# Patient Record
Sex: Female | Born: 1945 | Race: White | Hispanic: No | Smoking: Former smoker
Health system: Southern US, Community
[De-identification: ages and names within clinical notes are randomized; demographics above are authoritative.]

## PROBLEM LIST (undated history)

## (undated) DIAGNOSIS — Z720 Tobacco use: Secondary | ICD-10-CM

## (undated) DIAGNOSIS — N6019 Diffuse cystic mastopathy of unspecified breast: Secondary | ICD-10-CM

## (undated) DIAGNOSIS — Z87891 Personal history of nicotine dependence: Principal | ICD-10-CM

## (undated) DIAGNOSIS — D751 Secondary polycythemia: Secondary | ICD-10-CM

## (undated) DIAGNOSIS — M109 Gout, unspecified: Secondary | ICD-10-CM

## (undated) DIAGNOSIS — J449 Chronic obstructive pulmonary disease, unspecified: Secondary | ICD-10-CM

## (undated) DIAGNOSIS — I10 Essential (primary) hypertension: Secondary | ICD-10-CM

## (undated) DIAGNOSIS — I5032 Chronic diastolic (congestive) heart failure: Secondary | ICD-10-CM

## (undated) DIAGNOSIS — I2699 Other pulmonary embolism without acute cor pulmonale: Secondary | ICD-10-CM

## (undated) DIAGNOSIS — I82402 Acute embolism and thrombosis of unspecified deep veins of left lower extremity: Secondary | ICD-10-CM

## (undated) HISTORY — DX: Personal history of nicotine dependence: Z87.891

## (undated) HISTORY — DX: Chronic diastolic (congestive) heart failure: I50.32

## (undated) HISTORY — DX: Acute embolism and thrombosis of unspecified deep veins of left lower extremity: I82.402

## (undated) HISTORY — DX: Chronic obstructive pulmonary disease, unspecified: J44.9

## (undated) HISTORY — DX: Tobacco use: Z72.0

## (undated) HISTORY — DX: Diffuse cystic mastopathy of unspecified breast: N60.19

## (undated) HISTORY — DX: Secondary polycythemia: D75.1

## (undated) HISTORY — PX: TUBAL LIGATION: SHX77

## (undated) HISTORY — DX: Essential (primary) hypertension: I10

## (undated) HISTORY — DX: Other pulmonary embolism without acute cor pulmonale: I26.99

## (undated) HISTORY — PX: OTHER SURGICAL HISTORY: SHX169

## (undated) HISTORY — PX: CHOLECYSTECTOMY: SHX55

## (undated) HISTORY — DX: Gout, unspecified: M10.9

---

## 2010-06-22 ENCOUNTER — Ambulatory Visit: Payer: Self-pay | Admitting: Internal Medicine

## 2010-07-17 ENCOUNTER — Ambulatory Visit: Payer: Self-pay | Admitting: Internal Medicine

## 2010-10-23 ENCOUNTER — Ambulatory Visit: Payer: Self-pay | Admitting: Internal Medicine

## 2012-04-24 ENCOUNTER — Inpatient Hospital Stay: Payer: Self-pay | Admitting: Internal Medicine

## 2012-04-24 LAB — BASIC METABOLIC PANEL
BUN: 35 mg/dL — ABNORMAL HIGH (ref 7–18)
Calcium, Total: 9.3 mg/dL (ref 8.5–10.1)
Creatinine: 0.87 mg/dL (ref 0.60–1.30)
EGFR (African American): >60
Glucose: 92 mg/dL (ref 65–99)
Osmolality: 287 (ref 275–301)
Potassium: 3.9 mmol/L (ref 3.5–5.1)
Sodium: 140 mmol/L (ref 136–145)

## 2012-04-24 LAB — URINALYSIS, COMPLETE
Hyaline Cast: 4
Ketone: NEGATIVE
Leukocyte Esterase: NEGATIVE
Ph: 5 (ref 4.5–8.0)
Protein: NEGATIVE
RBC,UR: 2 /HPF (ref 0–5)
Specific Gravity: 1.018 (ref 1.003–1.030)
Squamous Epithelial: 2
WBC UR: 1 /HPF (ref 0–5)

## 2012-04-24 LAB — TROPONIN I: Troponin-I: 0.02 ng/mL

## 2012-04-24 LAB — CBC WITH DIFFERENTIAL/PLATELET
Basophil #: 0 10*3/uL (ref 0.0–0.1)
Basophil %: 0.3 %
Eosinophil #: 0.1 10*3/uL (ref 0.0–0.7)
HCT: 62 % — ABNORMAL HIGH (ref 35.0–47.0)
HGB: 19.6 g/dL — ABNORMAL HIGH (ref 12.0–16.0)
Lymphocyte %: 14.7 %
MCH: 28.1 pg (ref 26.0–34.0)
MCHC: 31.6 g/dL — ABNORMAL LOW (ref 32.0–36.0)
Monocyte #: 0.4 x10 3/mm (ref 0.2–0.9)
Monocyte %: 5.3 %
Neutrophil #: 6.1 10*3/uL (ref 1.4–6.5)
Neutrophil %: 78.8 %
Platelet: 147 10*3/uL — ABNORMAL LOW (ref 150–440)
WBC: 7.8 10*3/uL (ref 3.6–11.0)

## 2012-04-25 DIAGNOSIS — I5021 Acute systolic (congestive) heart failure: Secondary | ICD-10-CM

## 2012-04-25 DIAGNOSIS — I517 Cardiomegaly: Secondary | ICD-10-CM

## 2012-04-25 DIAGNOSIS — J9600 Acute respiratory failure, unspecified whether with hypoxia or hypercapnia: Secondary | ICD-10-CM

## 2012-04-25 LAB — APTT: Activated PTT: 35.9 secs (ref 23.6–35.9)

## 2012-04-25 LAB — BASIC METABOLIC PANEL
Anion Gap: 6 — ABNORMAL LOW (ref 7–16)
BUN: 36 mg/dL — ABNORMAL HIGH (ref 7–18)
Calcium, Total: 9.1 mg/dL (ref 8.5–10.1)
Creatinine: 0.99 mg/dL (ref 0.60–1.30)
EGFR (African American): >60
EGFR (Non-African Amer.): 59 — ABNORMAL LOW
Glucose: 162 mg/dL — ABNORMAL HIGH (ref 65–99)
Osmolality: 299 (ref 275–301)
Potassium: 3.8 mmol/L (ref 3.5–5.1)
Sodium: 144 mmol/L (ref 136–145)

## 2012-04-25 LAB — CK TOTAL AND CKMB (NOT AT ARMC)
CK, Total: 23 U/L (ref 21–215)
CK, Total: 25 U/L (ref 21–215)
CK-MB: 1.1 ng/mL (ref 0.5–3.6)
CK-MB: 1.3 ng/mL (ref 0.5–3.6)

## 2012-04-25 LAB — CBC WITH DIFFERENTIAL/PLATELET
Basophil #: 0 10*3/uL (ref 0.0–0.1)
Basophil %: 0.4 %
Basophil %: 0.4 %
Eosinophil #: 0 10*3/uL (ref 0.0–0.7)
Eosinophil #: 0 10*3/uL (ref 0.0–0.7)
Eosinophil %: 0.1 %
HCT: 57.6 % — ABNORMAL HIGH (ref 35.0–47.0)
HGB: 19.1 g/dL — ABNORMAL HIGH (ref 12.0–16.0)
HGB: 18.2 g/dL — ABNORMAL HIGH (ref 12.0–16.0)
Lymphocyte #: 0.5 10*3/uL — ABNORMAL LOW (ref 1.0–3.6)
Lymphocyte %: 6.6 %
MCH: 28.5 pg (ref 26.0–34.0)
MCHC: 32.3 g/dL (ref 32.0–36.0)
MCV: 88 fL (ref 80–100)
MCV: 88 fL (ref 80–100)
Monocyte #: 0.1 x10 3/mm — ABNORMAL LOW (ref 0.2–0.9)
Monocyte %: 1 %
Monocyte %: 8.3 %
Neutrophil #: 6.9 10*3/uL — ABNORMAL HIGH (ref 1.4–6.5)
Neutrophil %: 90.7 %
RBC: 6.52 10*6/uL — ABNORMAL HIGH (ref 3.80–5.20)
RDW: 18.6 % — ABNORMAL HIGH (ref 11.5–14.5)
WBC: 8.2 10*3/uL (ref 3.6–11.0)

## 2012-04-25 LAB — HEMATOCRIT: HCT: 57.6 % — ABNORMAL HIGH (ref 35.0–47.0)

## 2012-04-25 LAB — TROPONIN I: Troponin-I: <0.02 ng/mL

## 2012-04-26 LAB — CBC WITH DIFFERENTIAL/PLATELET
Basophil %: 0.4 %
Eosinophil %: 0 %
HCT: 54.2 % — ABNORMAL HIGH (ref 35.0–47.0)
HGB: 17.5 g/dL — ABNORMAL HIGH (ref 12.0–16.0)
MCH: 28.4 pg (ref 26.0–34.0)
MCHC: 32.3 g/dL (ref 32.0–36.0)
MCV: 88 fL (ref 80–100)
Monocyte %: 2.2 %
Neutrophil #: 10.2 10*3/uL — ABNORMAL HIGH (ref 1.4–6.5)
Neutrophil %: 93.6 %
WBC: 10.9 10*3/uL (ref 3.6–11.0)

## 2012-04-27 LAB — BASIC METABOLIC PANEL
Anion Gap: 4 — ABNORMAL LOW (ref 7–16)
Chloride: 91 mmol/L — ABNORMAL LOW (ref 98–107)
Co2: 45 mmol/L (ref 21–32)
Creatinine: 0.92 mg/dL (ref 0.60–1.30)
Glucose: 104 mg/dL — ABNORMAL HIGH (ref 65–99)
Osmolality: 287 (ref 275–301)
Potassium: 3.8 mmol/L (ref 3.5–5.1)
Sodium: 140 mmol/L (ref 136–145)

## 2012-04-27 LAB — CA 125: CA 125: 43.4 U/mL — ABNORMAL HIGH (ref 0.0–34.0)

## 2012-04-27 LAB — CANCER ANTIGEN 27.29: CA 27.29: 12.9 U/mL (ref 0.0–38.6)

## 2012-04-27 LAB — CBC WITH DIFFERENTIAL/PLATELET
Basophil #: 0 10*3/uL (ref 0.0–0.1)
Lymphocyte #: 0.6 10*3/uL — ABNORMAL LOW (ref 1.0–3.6)
MCH: 27.9 pg (ref 26.0–34.0)
MCHC: 31.8 g/dL — ABNORMAL LOW (ref 32.0–36.0)
MCV: 88 fL (ref 80–100)
Monocyte #: 0.8 x10 3/mm (ref 0.2–0.9)
Monocyte %: 6.7 %
Neutrophil #: 10.9 10*3/uL — ABNORMAL HIGH (ref 1.4–6.5)
Neutrophil %: 88 %
RBC: 6.04 10*6/uL — ABNORMAL HIGH (ref 3.80–5.20)
RDW: 19 % — ABNORMAL HIGH (ref 11.5–14.5)
WBC: 12.3 10*3/uL — ABNORMAL HIGH (ref 3.6–11.0)

## 2012-04-27 LAB — CANCER ANTIGEN 19-9: CA 19-9: 16 U/mL (ref 0–35)

## 2012-04-28 LAB — CREATININE, SERUM
Creatinine: 1 mg/dL (ref 0.60–1.30)
EGFR (African American): >60

## 2012-04-28 LAB — CBC WITH DIFFERENTIAL/PLATELET
Eosinophil #: 0 10*3/uL (ref 0.0–0.7)
Eosinophil %: 0.4 %
Lymphocyte %: 17.2 %
MCH: 27.8 pg (ref 26.0–34.0)
Monocyte #: 0.9 x10 3/mm (ref 0.2–0.9)
Monocyte %: 8.6 %
Neutrophil #: 7.9 10*3/uL — ABNORMAL HIGH (ref 1.4–6.5)
Neutrophil %: 73.6 %
Platelet: 203 10*3/uL (ref 150–440)
RBC: 6.66 10*6/uL — ABNORMAL HIGH (ref 3.80–5.20)
WBC: 10.7 10*3/uL (ref 3.6–11.0)

## 2012-04-28 LAB — HEMOGLOBIN: HGB: 17.6 g/dL — ABNORMAL HIGH (ref 12.0–16.0)

## 2012-04-29 LAB — CBC WITH DIFFERENTIAL/PLATELET
Basophil %: 0.1 %
Eosinophil #: 0 10*3/uL (ref 0.0–0.7)
Eosinophil %: 0.1 %
HGB: 17 g/dL — ABNORMAL HIGH (ref 12.0–16.0)
Lymphocyte #: 1.1 10*3/uL (ref 1.0–3.6)
Lymphocyte %: 10.4 %
MCHC: 33.1 g/dL (ref 32.0–36.0)
Monocyte %: 10.4 %
Neutrophil %: 79 %
RBC: 5.9 10*6/uL — ABNORMAL HIGH (ref 3.80–5.20)
WBC: 10.2 10*3/uL (ref 3.6–11.0)

## 2012-04-29 LAB — CANCER ANTIGEN 27.29: CA 27.29: 14.4 U/mL (ref 0.0–38.6)

## 2012-04-30 LAB — CBC WITH DIFFERENTIAL/PLATELET
Basophil #: 0 10*3/uL (ref 0.0–0.1)
Eosinophil #: 0.1 10*3/uL (ref 0.0–0.7)
Eosinophil %: 0.6 %
HCT: 51.7 % — ABNORMAL HIGH (ref 35.0–47.0)
HGB: 16.4 g/dL — ABNORMAL HIGH (ref 12.0–16.0)
Lymphocyte %: 13.9 %
MCH: 27.5 pg (ref 26.0–34.0)
MCHC: 31.8 g/dL — ABNORMAL LOW (ref 32.0–36.0)
MCV: 87 fL (ref 80–100)
Monocyte #: 0.9 x10 3/mm (ref 0.2–0.9)
Neutrophil %: 75.4 %

## 2012-04-30 LAB — BASIC METABOLIC PANEL
Calcium, Total: 9.8 mg/dL (ref 8.5–10.1)
EGFR (African American): >60
EGFR (Non-African Amer.): >60
Glucose: 98 mg/dL (ref 65–99)
Potassium: 3.6 mmol/L (ref 3.5–5.1)
Sodium: 136 mmol/L (ref 136–145)

## 2012-05-01 LAB — BASIC METABOLIC PANEL
Calcium, Total: 10 mg/dL (ref 8.5–10.1)
Chloride: 94 mmol/L — ABNORMAL LOW (ref 98–107)
EGFR (African American): >60
EGFR (Non-African Amer.): >60
Potassium: 3.6 mmol/L (ref 3.5–5.1)

## 2012-05-01 LAB — HEMOGLOBIN: HGB: 17.3 g/dL — ABNORMAL HIGH (ref 12.0–16.0)

## 2012-05-05 ENCOUNTER — Ambulatory Visit: Payer: Self-pay | Admitting: Internal Medicine

## 2012-05-05 LAB — CANCER CENTER HEMOGLOBIN: HGB: 16.4 g/dL — ABNORMAL HIGH (ref 12.0–16.0)

## 2012-05-05 LAB — CANCER CENTER HEMATOCRIT: HCT: 51.4 % — ABNORMAL HIGH (ref 35.0–47.0)

## 2012-05-07 LAB — CANCER CENTER HEMATOCRIT: HCT: 48.9 % — ABNORMAL HIGH (ref 35.0–47.0)

## 2012-05-07 LAB — URINALYSIS, COMPLETE
Ketone: NEGATIVE
Leukocyte Esterase: NEGATIVE
Nitrite: NEGATIVE
Protein: NEGATIVE

## 2012-05-11 LAB — CANCER CENTER HEMATOCRIT: HCT: 49.1 % — ABNORMAL HIGH (ref 35.0–47.0)

## 2012-05-13 ENCOUNTER — Ambulatory Visit: Payer: Self-pay | Admitting: Internal Medicine

## 2012-05-14 ENCOUNTER — Encounter: Payer: Self-pay | Admitting: *Deleted

## 2012-05-20 ENCOUNTER — Ambulatory Visit (INDEPENDENT_AMBULATORY_CARE_PROVIDER_SITE_OTHER): Payer: Medicare Other | Admitting: Nurse Practitioner

## 2012-05-20 ENCOUNTER — Encounter: Payer: Self-pay | Admitting: Nurse Practitioner

## 2012-05-20 VITALS — BP 116/79 | HR 85 | Ht <= 58 in | Wt 149.0 lb

## 2012-05-20 DIAGNOSIS — Z720 Tobacco use: Secondary | ICD-10-CM

## 2012-05-20 DIAGNOSIS — I509 Heart failure, unspecified: Secondary | ICD-10-CM

## 2012-05-20 DIAGNOSIS — F17200 Nicotine dependence, unspecified, uncomplicated: Secondary | ICD-10-CM

## 2012-05-20 DIAGNOSIS — E876 Hypokalemia: Secondary | ICD-10-CM

## 2012-05-20 DIAGNOSIS — I82402 Acute embolism and thrombosis of unspecified deep veins of left lower extremity: Secondary | ICD-10-CM | POA: Insufficient documentation

## 2012-05-20 DIAGNOSIS — R0602 Shortness of breath: Secondary | ICD-10-CM

## 2012-05-20 DIAGNOSIS — N6019 Diffuse cystic mastopathy of unspecified breast: Secondary | ICD-10-CM | POA: Insufficient documentation

## 2012-05-20 DIAGNOSIS — I82409 Acute embolism and thrombosis of unspecified deep veins of unspecified lower extremity: Secondary | ICD-10-CM

## 2012-05-20 DIAGNOSIS — D751 Secondary polycythemia: Secondary | ICD-10-CM | POA: Insufficient documentation

## 2012-05-20 DIAGNOSIS — J441 Chronic obstructive pulmonary disease with (acute) exacerbation: Secondary | ICD-10-CM | POA: Insufficient documentation

## 2012-05-20 DIAGNOSIS — I2699 Other pulmonary embolism without acute cor pulmonale: Secondary | ICD-10-CM | POA: Insufficient documentation

## 2012-05-20 DIAGNOSIS — F17201 Nicotine dependence, unspecified, in remission: Secondary | ICD-10-CM | POA: Insufficient documentation

## 2012-05-20 DIAGNOSIS — I5032 Chronic diastolic (congestive) heart failure: Secondary | ICD-10-CM

## 2012-05-20 DIAGNOSIS — I10 Essential (primary) hypertension: Secondary | ICD-10-CM

## 2012-05-20 NOTE — Progress Notes (Signed)
Patient Name: Erica Poole Date of Encounter: 05/20/2012  Primary Care Provider:  Bari Edward, MD Primary Cardiologist:  Concha Se, MD  Patient Profile  67 year old female with recent history of bilateral PE and left lower extremity DVT, who presents for followup.  Problem List   Past Medical History  Diagnosis Date  . Hypertension   . COPD (chronic obstructive pulmonary disease)   . Fibrocystic breast disease   . Tobacco abuse     a. quit 04/2012  . Bilateral pulmonary embolism     a. 04/24/2012 -->Started on Xarelto;  b. 04/28/2012 s/p IVC Filter.  . Left leg DVT     a. 04/24/2012 -->Started on Xarelto  . Polycythemia   . Chronic diastolic CHF (congestive heart failure)     a. 04/2012 Echo: EF 55%, mildly dil RV w/ mod reduced RV fxn, mild bi-atrial enlargement.   Past Surgical History  Procedure Date  . Breast lumpectomy   . Cholecystectomy     Allergies  No Known Allergies  HPI  67 year old female with the above problem list. In December, she was admitted to [facility] regional Med therapy and subsequently discharged. ical Center with complaints of dyspnea. There was initially some concern for CHF. She subsequently underwent workup for PE and was found to have bilateral pulmonary emboli as well as a left lower extremity DVT. An echocardiogram showed normal LV function was moderately reduced RV function. She was placed on Xarelto therapy, IVC filter was placed and she was subsequently discharged. Since her discharge, she has been doing very well. She denies dyspnea on exertion. Though she did have mild extremity edema while hospitalized, this is completely resolved. She was discharged home on Lasix therapy advised to discontinue it when it ran out. Despite now being off of Lasix, she continues take supplemental potassium. She has quit smoking and has been watching her sodium intake closely. She weighs herself daily and reports that it has been stable. She reports that  she took is Xarelto 50 mg twice a day for 14 days following discharge and then about 2 weeks ago reduced the dose to 15 mg daily. She has 20 mg tablets at home but hasn't started them yet. She says she was advised to reduced to the 15 mg daily dose at discharge.  Home Medications  Prior to Admission medications   Medication Sig Start Date End Date Taking? Authorizing Provider  fluticasone-salmeterol (ADVAIR HFA) 230-21 MCG/ACT inhaler Inhale 2 puffs into the lungs 2 (two) times daily.   Yes Historical Provider, MD  guaiFENesin (MUCINEX) 600 MG 12 hr tablet Take 1,200 mg by mouth every 12 (twelve) hours.   Yes Historical Provider, MD  ipratropium-albuterol (DUONEB) 0.5-2.5 (3) MG/3ML SOLN Take 3 mLs by nebulization 4 (four) times daily.   Yes Historical Provider, MD  nicotine (NICODERM CQ - DOSED IN MG/24 HOURS) 14 mg/24hr patch Place 1 patch onto the skin as needed.    Yes Historical Provider, MD  Rivaroxaban (XARELTO) 20 MG TABS Take by mouth daily.   Yes Historical Provider, MD  tiotropium (SPIRIVA) 18 MCG inhalation capsule Place 18 mcg into inhaler and inhale daily.   Yes Historical Provider, MD   Review of Systems  She denies pnd, orthopnea, n, v, dizziness, syncope, edema, weight gain, or early satiety. All other systems reviewed and are otherwise negative except as noted above.  Physical Exam  Blood pressure 116/79, pulse 85, height 4\' 10"  (1.473 m), weight 149 lb (67.586 kg).  General: Pleasant, NAD  Psych: Normal affect. Neuro: Alert and oriented X 3. Moves all extremities spontaneously. HEENT: Normal  Neck: Supple without bruits or JVD. Lungs:  Resp regular and unlabored, diminished @ bases, otw cta. Heart: RRR, distant, no s3, s4, or murmurs. Abdomen: Soft, non-tender, non-distended, BS + x 4.  Extremities: No clubbing, cyanosis or edema. DP/PT/Radials 2+ and equal bilaterally.  Accessory Clinical Findings  ECG - regular sinus rhythm, 85, no acute ST or T  changes.  Assessment & Plan  1.  Left lower extremity DVT and bilateral pulmonary emboli:  Patient status post IVC filter and has been maintained on Xarelto therapy. After completing 2 weeks at the 15 mg twice a day dose, she reduced her dose to 15 mg daily. She says that she was advised to do this at the time of discharge. She has about 3 more 15 mg tablets. She took 15 mg this morning.  I have advised that she take 15 mg twice a day until her 15 mg tablets are completed, which she says will be within the next day or 2.  She will then initiate 20 mg daily.   2. Chronic diastolic congestive heart failure: Patient had mild volume overload in the setting of right ventricular strain and pulmonary emboli at the time of her hospitalization. She is no longer on diuretic therapy and her weight has been stable. She is watching her sodium intake closely. Her blood pressure is well-controlled without the use of antihypertensives.  3. Hypokalemia: Patient was apparently hypokalemic while on diuretic therapy during hospitalization. She was discharged on Lasix and potassium with the advice that she should stop both when she runs out. She ran out of Lasix but continues to take potassium. I've advised her to discontinue potassium and we will check a basic metabolic panel today.  4. Tobacco abuse/COPD: She quit smoking following hospitalization. She is concerned that she was told by pulmonology that followup will be arranged for her as far she knows he has not been. She is asked for [place] to arrange for followup with Dr. Meredeth Ide, whom she saw in the hospital. We will do this for her.  5. Hypertension: stable.  6. Disposition: Followup with Dr. Mariah Milling in 6 months.  Nicolasa Ducking, NP 05/20/2012, 9:54 AM

## 2012-05-20 NOTE — Patient Instructions (Addendum)
Your physician wants you to follow-up in: 6 months. You will receive a reminder letter in the mail two months in advance. If you don't receive a letter, please call our office to schedule the follow-up appointment.  Your physician has recommended you make the following change in your medication:  -stop potassium until we get lab results back  Follow up with Dr. Meredeth Ide (pulmonologist)

## 2012-05-21 LAB — BASIC METABOLIC PANEL
BUN/Creatinine Ratio: 10 — ABNORMAL LOW (ref 11–26)
Creatinine, Ser: 1.17 mg/dL — ABNORMAL HIGH (ref 0.57–1.00)
GFR calc non Af Amer: 48 mL/min/{1.73_m2} — ABNORMAL LOW (ref >59)
Sodium: 132 mmol/L — ABNORMAL LOW (ref 134–144)

## 2012-05-22 ENCOUNTER — Telehealth: Payer: Self-pay

## 2012-05-22 ENCOUNTER — Other Ambulatory Visit: Payer: Self-pay

## 2012-05-22 DIAGNOSIS — I5032 Chronic diastolic (congestive) heart failure: Secondary | ICD-10-CM

## 2012-05-22 DIAGNOSIS — I10 Essential (primary) hypertension: Secondary | ICD-10-CM

## 2012-05-22 DIAGNOSIS — Z79899 Other long term (current) drug therapy: Secondary | ICD-10-CM

## 2012-05-22 NOTE — Telephone Encounter (Signed)
Notified patient to stay off the potassium for now with a repeat BMET in one week per Nicolasa Ducking, PA. Patient understands and will follow instructions.

## 2012-05-22 NOTE — Telephone Encounter (Signed)
Patient would like to know if should go back on potassium. She was instructed at the office visit on Jan. 8, 2013 to hold potassium until BMET results back. Please advise what patient should do.

## 2012-05-28 ENCOUNTER — Ambulatory Visit (INDEPENDENT_AMBULATORY_CARE_PROVIDER_SITE_OTHER): Payer: Medicare Other

## 2012-05-28 DIAGNOSIS — I10 Essential (primary) hypertension: Secondary | ICD-10-CM

## 2012-05-28 DIAGNOSIS — I509 Heart failure, unspecified: Secondary | ICD-10-CM

## 2012-05-28 DIAGNOSIS — I5032 Chronic diastolic (congestive) heart failure: Secondary | ICD-10-CM

## 2012-05-28 DIAGNOSIS — Z79899 Other long term (current) drug therapy: Secondary | ICD-10-CM

## 2012-05-29 LAB — BASIC METABOLIC PANEL
CO2: 22 mmol/L (ref 19–28)
Calcium: 9.7 mg/dL (ref 8.6–10.2)
Chloride: 101 mmol/L (ref 97–108)
Glucose: 94 mg/dL (ref 65–99)
Potassium: 4.4 mmol/L (ref 3.5–5.2)
Sodium: 138 mmol/L (ref 134–144)

## 2012-06-02 LAB — HEMOGLOBIN: HGB: 14.1 g/dL (ref 12.0–16.0)

## 2012-06-12 ENCOUNTER — Ambulatory Visit: Payer: Self-pay | Admitting: Internal Medicine

## 2012-06-13 ENCOUNTER — Ambulatory Visit: Payer: Self-pay | Admitting: Internal Medicine

## 2012-06-30 LAB — CANCER CENTER HEMATOCRIT: HCT: 41 % (ref 35.0–47.0)

## 2012-07-11 ENCOUNTER — Ambulatory Visit: Payer: Self-pay | Admitting: Internal Medicine

## 2012-08-18 ENCOUNTER — Ambulatory Visit: Payer: Self-pay | Admitting: Internal Medicine

## 2012-08-18 LAB — CANCER CENTER HEMATOCRIT: HCT: 40.7 % (ref 35.0–47.0)

## 2012-09-10 ENCOUNTER — Ambulatory Visit: Payer: Self-pay | Admitting: Internal Medicine

## 2012-11-02 ENCOUNTER — Ambulatory Visit: Payer: Self-pay | Admitting: Unknown Physician Specialty

## 2012-11-03 LAB — PATHOLOGY REPORT

## 2013-01-26 ENCOUNTER — Other Ambulatory Visit: Payer: Self-pay | Admitting: *Deleted

## 2013-01-27 MED ORDER — RIVAROXABAN 20 MG PO TABS: 20.0000 mg | ORAL_TABLET | Freq: Every day | ORAL | Status: DC

## 2013-02-09 ENCOUNTER — Ambulatory Visit (INDEPENDENT_AMBULATORY_CARE_PROVIDER_SITE_OTHER): Payer: Medicare Other | Admitting: Cardiovascular Disease

## 2013-02-09 ENCOUNTER — Encounter: Payer: Self-pay | Admitting: Cardiovascular Disease

## 2013-02-09 VITALS — BP 162/108 | HR 63 | Ht 59.5 in | Wt 171.0 lb

## 2013-02-09 DIAGNOSIS — J449 Chronic obstructive pulmonary disease, unspecified: Secondary | ICD-10-CM

## 2013-02-09 DIAGNOSIS — Z720 Tobacco use: Secondary | ICD-10-CM

## 2013-02-09 DIAGNOSIS — R062 Wheezing: Secondary | ICD-10-CM

## 2013-02-09 DIAGNOSIS — I2699 Other pulmonary embolism without acute cor pulmonale: Secondary | ICD-10-CM

## 2013-02-09 DIAGNOSIS — F17200 Nicotine dependence, unspecified, uncomplicated: Secondary | ICD-10-CM

## 2013-02-09 DIAGNOSIS — I10 Essential (primary) hypertension: Secondary | ICD-10-CM

## 2013-02-09 DIAGNOSIS — R0989 Other specified symptoms and signs involving the circulatory and respiratory systems: Secondary | ICD-10-CM

## 2013-02-09 MED ORDER — POTASSIUM CHLORIDE ER 10 MEQ PO TBCR: 10.0000 meq | EXTENDED_RELEASE_TABLET | Freq: Every day | ORAL | Status: DC | PRN

## 2013-02-09 MED ORDER — LISINOPRIL-HYDROCHLOROTHIAZIDE 10-12.5 MG PO TABS: 1.0000 | ORAL_TABLET | Freq: Every day | ORAL | Status: DC

## 2013-02-09 MED ORDER — FUROSEMIDE 20 MG PO TABS: 20.0000 mg | ORAL_TABLET | Freq: Every day | ORAL | Status: DC | PRN

## 2013-02-09 MED ORDER — ALBUTEROL SULFATE HFA 108 (90 BASE) MCG/ACT IN AERS: 2.0000 | INHALATION_SPRAY | Freq: Four times a day (QID) | RESPIRATORY_TRACT | Status: DC | PRN

## 2013-02-09 NOTE — Assessment & Plan Note (Signed)
Blood pressure is elevated. Given that she has mild leg edema, we will start lisinopril HCTZ 10/12.5 mg daily

## 2013-02-09 NOTE — Assessment & Plan Note (Signed)
We have encouraged her to continue to work on weaning her cigarettes and smoking cessation. She will continue to work on this and does not want any assistance with chantix.  

## 2013-02-09 NOTE — Assessment & Plan Note (Signed)
We have suggested that she stay on her anticoagulation, xarelto 20 mg daily.

## 2013-02-09 NOTE — Progress Notes (Signed)
Patient ID: Erica Poole, female    DOB: February 18, 1946, 67 y.o.   MRN: [id number]  HPI Comments: 67 year old female with  history of bilateral PE and left lower extremity DVT in December 2013, who presents for followup.  In followup today, she reports that she is doing well. She has started smoking again secondary to significant stress at home. She denies any significant lower extremity swelling. She is tolerating anticoagulation well with no symptoms. She denies any chest pain. She does have mild baseline shortness of breath, possibly from smoking. Periodically she has worsening lower extremity edema. She does not have any Lasix. She feels that her blood pressure has been running high recently.  Previous  echocardiogram showed normal LV function was moderately reduced RV function.  Status post IVC filter  placed at the time of her PE .   EKG today shows normal sinus rhythm with rate 63 beats per minute, no significant ST or T wave changes  Past Medical History . Hypertension  . COPD (chronic obstructive pulmonary disease)  . Fibrocystic breast disease  . Tobacco abuse    a. quit 04/2012 . Bilateral pulmonary embolism    a. 04/24/2012 -->Started on Xarelto;  b. 04/28/2012 s/p IVC Filter. . Left leg DVT    a. 04/24/2012 -->Started on Xarelto . Polycythemia  . Chronic diastolic CHF (congestive heart failure)    a. 04/2012 Echo: EF 55%, mildly dil RV w/ mod reduced RV fxn, mild bi-atrial enlargement.     Outpatient Encounter Prescriptions as of 02/09/2013  Medication Sig Dispense Refill  . ipratropium-albuterol (DUONEB) 0.5-2.5 (3) MG/3ML SOLN Take 3 mLs by nebulization 4 (four) times daily.      . Rivaroxaban (XARELTO) 20 MG TABS tablet Take 1 tablet (20 mg total) by mouth daily.  30 tablet  3  . SYMBICORT 160-4.5 MCG/ACT inhaler Inhale 2 puffs into the lungs 2 (two) times daily.       Marland Kitchen albuterol (PROVENTIL HFA;VENTOLIN HFA) 108 (90 BASE) MCG/ACT inhaler Inhale 2 puffs into the lungs  every 6 (six) hours as needed for wheezing.  1 Inhaler  6  . furosemide (LASIX) 20 MG tablet Take 1 tablet (20 mg total) by mouth daily as needed.  90 tablet  3  . lisinopril-hydrochlorothiazide (PRINZIDE,ZESTORETIC) 10-12.5 MG per tablet Take 1 tablet by mouth daily.  30 tablet  6  . potassium chloride (K-DUR) 10 MEQ tablet Take 1 tablet (10 mEq total) by mouth daily as needed.  90 tablet  3  . [DISCONTINUED] fluticasone-salmeterol (ADVAIR HFA) 230-21 MCG/ACT inhaler Inhale 2 puffs into the lungs 2 (two) times daily.      . [DISCONTINUED] guaiFENesin (MUCINEX) 600 MG 12 hr tablet Take 1,200 mg by mouth every 12 (twelve) hours.      . [DISCONTINUED] nicotine (NICODERM CQ - DOSED IN MG/24 HOURS) 14 mg/24hr patch Place 1 patch onto the skin as needed.       . [DISCONTINUED] tiotropium (SPIRIVA) 18 MCG inhalation capsule Place 18 mcg into inhaler and inhale daily.       No facility-administered encounter medications on file as of 02/09/2013.     Review of Systems  Constitutional: Negative.   HENT: Negative.   Eyes: Negative.   Respiratory: Positive for shortness of breath.   Cardiovascular: Positive for leg swelling.  Gastrointestinal: Negative.   Endocrine: Negative.   Musculoskeletal: Negative.   Skin: Negative.   Allergic/Immunologic: Negative.   Neurological: Negative.   Hematological: Negative.   Psychiatric/Behavioral: Negative.  All other systems reviewed and are negative.    BP 162/108  Pulse 63  Ht 4' 11.5" (1.511 m)  Wt 171 lb (77.565 kg)  BMI 33.97 kg/m2  Physical Exam  Nursing note and vitals reviewed. Constitutional: She is oriented to person, place, and time. She appears well-developed and well-nourished.  HENT:  Head: Normocephalic.  Nose: Nose normal.  Mouth/Throat: Oropharynx is clear and moist.  Eyes: Conjunctivae are normal. Pupils are equal, round, and reactive to light.  Neck: Normal range of motion. Neck supple. No JVD present.  Cardiovascular: Normal  rate, regular rhythm, S1 normal, S2 normal, normal heart sounds and intact distal pulses.  Exam reveals no gallop and no friction rub.   No murmur heard. Pulmonary/Chest: Effort normal and breath sounds normal. No respiratory distress. She has no wheezes. She has no rales. She exhibits no tenderness.  Abdominal: Soft. Bowel sounds are normal. She exhibits no distension. There is no tenderness.  Musculoskeletal: Normal range of motion. She exhibits no edema and no tenderness.  Lymphadenopathy:    She has no cervical adenopathy.  Neurological: She is alert and oriented to person, place, and time. Coordination normal.  Skin: Skin is warm and dry. No rash noted. No erythema.  Psychiatric: She has a normal mood and affect. Her behavior is normal. Judgment and thought content normal.    Assessment and Plan

## 2013-02-09 NOTE — Patient Instructions (Addendum)
You are doing well.  Take lasix with potassium as needed for leg swelling  Please start lisinopril HCTZ one a day Monitor your blood pressure at home Try to quit smoking!!!  Please call us if you have new issues that need to be addressed before your next appt.  Your physician wants you to follow-up in: 1 month.

## 2013-02-09 NOTE — Assessment & Plan Note (Signed)
Mild chronic shortness of breath. Likely from mild COPD. encouraged smoking cessation

## 2013-02-27 ENCOUNTER — Ambulatory Visit: Payer: Self-pay | Admitting: Family Medicine

## 2013-03-10 ENCOUNTER — Ambulatory Visit: Payer: Self-pay | Admitting: Family Medicine

## 2013-03-15 ENCOUNTER — Ambulatory Visit: Payer: Medicare Other | Admitting: Cardiovascular Disease

## 2013-03-19 ENCOUNTER — Ambulatory Visit (INDEPENDENT_AMBULATORY_CARE_PROVIDER_SITE_OTHER): Payer: Medicare Other | Admitting: Cardiovascular Disease

## 2013-03-19 ENCOUNTER — Encounter: Payer: Self-pay | Admitting: Cardiovascular Disease

## 2013-03-19 VITALS — BP 160/80 | HR 64 | Ht 59.0 in | Wt 176.5 lb

## 2013-03-19 DIAGNOSIS — I10 Essential (primary) hypertension: Secondary | ICD-10-CM

## 2013-03-19 DIAGNOSIS — I2699 Other pulmonary embolism without acute cor pulmonale: Secondary | ICD-10-CM

## 2013-03-19 DIAGNOSIS — F17200 Nicotine dependence, unspecified, uncomplicated: Secondary | ICD-10-CM

## 2013-03-19 DIAGNOSIS — Z720 Tobacco use: Secondary | ICD-10-CM

## 2013-03-19 DIAGNOSIS — J449 Chronic obstructive pulmonary disease, unspecified: Secondary | ICD-10-CM

## 2013-03-19 NOTE — Assessment & Plan Note (Signed)
Blood pressure is well controlled on today's visit. No changes made to the medications. 

## 2013-03-19 NOTE — Assessment & Plan Note (Signed)
We have encouraged her to continue to work on weaning her cigarettes and smoking cessation. She will continue to work on this and does not want any assistance with chantix.  

## 2013-03-19 NOTE — Progress Notes (Signed)
Patient ID: Erica Poole, female    DOB: 02/15/1946, 67 y.o.   MRN: [id number]  HPI Comments: 67 year old female with  history of bilateral PE and left lower extremity DVT in December 2013, who presents for followup. At the time of her PE, she was sitting at her computer for many hours at a time. Previously seen by Dr. Neale Burly of hematology.   In followup today, she reports that she is doing well. She continues to smoke one half pack per day.  She denies any significant lower extremity swelling. She is tolerating anticoagulation well with no symptoms. She denies any chest pain. She does have mild baseline shortness of breath, possibly from smoking.   Previous  echocardiogram showed normal LV function was moderately reduced RV function.  Status post IVC filter  placed at the time of her PE .   EKG today shows normal sinus rhythm with rate 63 beats per minute, no significant ST or T wave changes   Past Medical History . Hypertension  . COPD (chronic obstructive pulmonary disease)  . Fibrocystic breast disease  . Tobacco abuse    a. quit 04/2012 . Bilateral pulmonary embolism    a. 04/24/2012 -->Started on Xarelto;  b. 04/28/2012 s/p IVC Filter. . Left leg DVT    a. 04/24/2012 -->Started on Xarelto . Polycythemia  . Chronic diastolic CHF (congestive heart failure)    a. 04/2012 Echo: EF 55%, mildly dil RV w/ mod reduced RV fxn, mild bi-atrial enlargement.     Outpatient Encounter Prescriptions as of 03/19/2013  Medication Sig  . furosemide (LASIX) 20 MG tablet Take 1 tablet (20 mg total) by mouth daily as needed.  Marland Kitchen ipratropium-albuterol (DUONEB) 0.5-2.5 (3) MG/3ML SOLN Take 3 mLs by nebulization 4 (four) times daily.  Marland Kitchen lisinopril-hydrochlorothiazide (PRINZIDE,ZESTORETIC) 10-12.5 MG per tablet Take 1 tablet by mouth daily.  . potassium chloride (K-DUR) 10 MEQ tablet Take 1 tablet (10 mEq total) by mouth daily as needed.  . Rivaroxaban (XARELTO) 20 MG TABS tablet Take 1 tablet (20  mg total) by mouth daily.  . SYMBICORT 160-4.5 MCG/ACT inhaler Inhale 2 puffs into the lungs 2 (two) times daily.   . [DISCONTINUED] albuterol (PROVENTIL HFA;VENTOLIN HFA) 108 (90 BASE) MCG/ACT inhaler Inhale 2 puffs into the lungs every 6 (six) hours as needed for wheezing.     Review of Systems  Constitutional: Negative.   HENT: Negative.   Eyes: Negative.   Gastrointestinal: Negative.   Endocrine: Negative.   Musculoskeletal: Negative.   Skin: Negative.   Allergic/Immunologic: Negative.   Neurological: Negative.   Hematological: Negative.   Psychiatric/Behavioral: Negative.   All other systems reviewed and are negative.    BP 160/80  Pulse 64  Ht 4\' 11"  (1.499 m)  Wt 176 lb 8 oz (80.06 kg)  BMI 35.63 kg/m2  Physical Exam  Nursing note and vitals reviewed. Constitutional: She is oriented to person, place, and time. She appears well-developed and well-nourished.  HENT:  Head: Normocephalic.  Nose: Nose normal.  Mouth/Throat: Oropharynx is clear and moist.  Eyes: Conjunctivae are normal. Pupils are equal, round, and reactive to light.  Neck: Normal range of motion. Neck supple. No JVD present.  Cardiovascular: Normal rate, regular rhythm, S1 normal, S2 normal, normal heart sounds and intact distal pulses.  Exam reveals no gallop and no friction rub.   No murmur heard. Pulmonary/Chest: Effort normal. No respiratory distress. She has decreased breath sounds. She has no wheezes. She has no rales. She exhibits no tenderness.  Abdominal: Soft. Bowel sounds are normal. She exhibits no distension. There is no tenderness.  Musculoskeletal: Normal range of motion. She exhibits no edema and no tenderness.  Lymphadenopathy:    She has no cervical adenopathy.  Neurological: She is alert and oriented to person, place, and time. Coordination normal.  Skin: Skin is warm and dry. No rash noted. No erythema.  Psychiatric: She has a normal mood and affect. Her behavior is normal. Judgment  and thought content normal.    Assessment and Plan

## 2013-03-19 NOTE — Assessment & Plan Note (Signed)
Poor lung sounds on clinical exam. Recommended she stop smoking

## 2013-03-19 NOTE — Assessment & Plan Note (Signed)
Currently doing well, recovered from her DVT and PE. We have suggested she hold her anticoagulation as she does have a filter in place. She is no longer sedentary. We have suggested she either take xarelto when she has long car trips or plane rides as needed, or wear compression hose or both

## 2013-03-19 NOTE — Patient Instructions (Addendum)
You can stop your xarelto Take xarelto only as needed for sitting at the computer, plane rides Please take aspirin one a day, coated.  Try to get compression hose with a zipper or wear regular compression hose Frequent walking when sitting at the computer  Check you blood pressure Goal number is <140 on the top, less than 90 on the bottom (<140/<90)  Please call us if you have new issues that need to be addressed before your next appt.  Your physician wants you to follow-up in: 12 months.  You will receive a reminder letter in the mail two months in advance. If you don't receive a letter, please call our office to schedule the follow-up appointment.

## 2013-04-05 ENCOUNTER — Telehealth: Payer: Self-pay

## 2013-04-05 NOTE — Telephone Encounter (Signed)
Would certainly agree with trying to get more blood pressure numbers from home, not just in the Dr. Isidore Moos Medication adjustments can be made if she can give [place] more numbers over the next week or 2

## 2013-04-05 NOTE — Telephone Encounter (Signed)
Spoke w/ pt.  She reports that she only took the lisinopril x 3 days, as it made her heart "go bang-bang-bang" and she "didn't like the noise. It sounded like a heart attack." Pt switched back to her atenolol 10mg , but reports that BP is "still kinda high" and she can't get it down. Pt does not check BP at home, only at doctor's visits, her last was "164 over something." Instructed pt to monitor BP at home and let us know readings.  Pt to call with numbers, but wanted to make Dr. Mariah Milling aware that lisinopril did not work and see if he would like to make any changes.

## 2013-04-05 NOTE — Telephone Encounter (Signed)
Pt states Lisinopril make her heart " feel like it was beating out of her chest",states she stopped the Lisinopril, and she started taking Atenolol 10 mg again. Please call.

## 2013-04-06 NOTE — Telephone Encounter (Signed)
Spoke w/ pt.  Reports that Dr. Gilda Crease put her on atenolol 10mg  x 1 month. She reports that she has a BP monitor at home that "doesn't work right", but she unplugged it and is hoping that will help. She will monitor BP at home for 2 weeks and call me with the readings.

## 2013-07-09 ENCOUNTER — Other Ambulatory Visit: Payer: Self-pay | Admitting: Nurse Practitioner

## 2013-11-26 ENCOUNTER — Observation Stay: Payer: Self-pay | Admitting: Internal Medicine

## 2013-11-26 ENCOUNTER — Ambulatory Visit: Payer: Self-pay | Admitting: Internal Medicine

## 2013-11-26 DIAGNOSIS — I498 Other specified cardiac arrhythmias: Secondary | ICD-10-CM

## 2013-11-26 LAB — CBC CANCER CENTER
Basophil #: 0.1 x10 3/mm (ref 0.0–0.1)
Basophil %: 0.8 %
EOS ABS: 0.2 x10 3/mm (ref 0.0–0.7)
Eosinophil %: 1.5 %
HCT: 59.7 % — AB (ref 35.0–47.0)
HGB: 18.9 g/dL — ABNORMAL HIGH (ref 12.0–16.0)
LYMPHS PCT: 13.5 %
Lymphocyte #: 1.3 x10 3/mm (ref 1.0–3.6)
MCH: 28.8 pg (ref 26.0–34.0)
MCHC: 31.7 g/dL — ABNORMAL LOW (ref 32.0–36.0)
MCV: 91 fL (ref 80–100)
MONOS PCT: 5.7 %
Monocyte #: 0.6 x10 3/mm (ref 0.2–0.9)
NEUTROS PCT: 78.5 %
Neutrophil #: 7.7 x10 3/mm — ABNORMAL HIGH (ref 1.4–6.5)
Platelet: 214 x10 3/mm (ref 150–440)
RBC: 6.57 10*6/uL — ABNORMAL HIGH (ref 3.80–5.20)
RDW: 16.7 % — AB (ref 11.5–14.5)
WBC: 9.9 x10 3/mm (ref 3.6–11.0)

## 2013-11-26 LAB — CREATININE, SERUM
Creatinine: 1.1 mg/dL (ref 0.60–1.30)
EGFR (African American): 60 — ABNORMAL LOW
GFR CALC NON AF AMER: 52 — AB

## 2013-11-26 LAB — HEMATOCRIT: HCT: 55.8 % — AB (ref 35.0–47.0)

## 2013-11-27 LAB — CREATININE, SERUM: Creatine, Serum: 0.88

## 2013-11-27 LAB — BASIC METABOLIC PANEL
ANION GAP: 6 — AB (ref 7–16)
BUN: 18 mg/dL (ref 7–18)
CALCIUM: 8.3 mg/dL — AB (ref 8.5–10.1)
Chloride: 102 mmol/L (ref 98–107)
Co2: 32 mmol/L (ref 21–32)
Creatinine: 0.88 mg/dL (ref 0.60–1.30)
GLUCOSE: 144 mg/dL — AB (ref 65–99)
Osmolality: 284 (ref 275–301)
POTASSIUM: 3.8 mmol/L (ref 3.5–5.1)
SODIUM: 140 mmol/L (ref 136–145)

## 2013-11-27 LAB — HEMATOCRIT: HCT: 50.3 % — ABNORMAL HIGH (ref 35.0–47.0)

## 2013-12-01 LAB — CBC CANCER CENTER
BASOS PCT: 1 %
Basophil #: 0.1 x10 3/mm (ref 0.0–0.1)
EOS ABS: 0.1 x10 3/mm (ref 0.0–0.7)
EOS PCT: 0.8 %
HCT: 46 % (ref 35.0–47.0)
HGB: 14.5 g/dL (ref 12.0–16.0)
LYMPHS ABS: 1.3 x10 3/mm (ref 1.0–3.6)
Lymphocyte %: 11.7 %
MCH: 28.6 pg (ref 26.0–34.0)
MCHC: 31.5 g/dL — ABNORMAL LOW (ref 32.0–36.0)
MCV: 91 fL (ref 80–100)
MONOS PCT: 4.6 %
Monocyte #: 0.5 x10 3/mm (ref 0.2–0.9)
Neutrophil #: 8.9 x10 3/mm — ABNORMAL HIGH (ref 1.4–6.5)
Neutrophil %: 81.9 %
PLATELETS: 218 x10 3/mm (ref 150–440)
RBC: 5.06 10*6/uL (ref 3.80–5.20)
RDW: 16.6 % — ABNORMAL HIGH (ref 11.5–14.5)
WBC: 10.8 x10 3/mm (ref 3.6–11.0)

## 2013-12-11 ENCOUNTER — Ambulatory Visit: Payer: Self-pay | Admitting: Internal Medicine

## 2013-12-14 LAB — CANCER CENTER HEMATOCRIT: HCT: 45.3 % (ref 35.0–47.0)

## 2013-12-31 ENCOUNTER — Ambulatory Visit: Payer: Self-pay | Admitting: Family Medicine

## 2014-01-11 ENCOUNTER — Ambulatory Visit: Payer: Self-pay | Admitting: Internal Medicine

## 2014-01-11 LAB — CANCER CENTER HEMATOCRIT: HCT: 43.4 % (ref 35.0–47.0)

## 2014-02-04 LAB — CANCER CENTER HEMATOCRIT: HCT: 46 % (ref 35.0–47.0)

## 2014-02-10 ENCOUNTER — Ambulatory Visit: Payer: Self-pay | Admitting: Internal Medicine

## 2014-03-14 ENCOUNTER — Encounter: Payer: Self-pay | Admitting: Cardiovascular Disease

## 2014-03-14 ENCOUNTER — Telehealth: Payer: Self-pay

## 2014-03-14 ENCOUNTER — Ambulatory Visit (INDEPENDENT_AMBULATORY_CARE_PROVIDER_SITE_OTHER): Payer: Medicare Other | Admitting: Cardiovascular Disease

## 2014-03-14 VITALS — BP 158/78 | HR 65 | Ht 59.0 in | Wt 187.8 lb

## 2014-03-14 DIAGNOSIS — I2699 Other pulmonary embolism without acute cor pulmonale: Secondary | ICD-10-CM

## 2014-03-14 DIAGNOSIS — I5032 Chronic diastolic (congestive) heart failure: Secondary | ICD-10-CM

## 2014-03-14 DIAGNOSIS — I82402 Acute embolism and thrombosis of unspecified deep veins of left lower extremity: Secondary | ICD-10-CM

## 2014-03-14 DIAGNOSIS — I159 Secondary hypertension, unspecified: Secondary | ICD-10-CM

## 2014-03-14 DIAGNOSIS — Z720 Tobacco use: Secondary | ICD-10-CM

## 2014-03-14 DIAGNOSIS — J439 Emphysema, unspecified: Secondary | ICD-10-CM

## 2014-03-14 MED ORDER — LISINOPRIL-HYDROCHLOROTHIAZIDE 10-12.5 MG PO TABS: 1.0000 | ORAL_TABLET | Freq: Every day | ORAL | Status: DC

## 2014-03-14 MED ORDER — LISINOPRIL-HYDROCHLOROTHIAZIDE 20-12.5 MG PO TABS: 2.0000 | ORAL_TABLET | Freq: Every day | ORAL | Status: DC

## 2014-03-14 MED ORDER — POTASSIUM CHLORIDE ER 10 MEQ PO TBCR: 10.0000 meq | EXTENDED_RELEASE_TABLET | Freq: Every day | ORAL | Status: DC

## 2014-03-14 NOTE — Patient Instructions (Addendum)
You are doing well. Please take two of the lisinopril HCTZ daily Take with potassium  We will give you a flu shot today  Please call us if you have new issues that need to be addressed before your next appt.  Your physician wants you to follow-up in: 12 months.  You will receive a reminder letter in the mail two months in advance. If you don't receive a letter, please call our office to schedule the follow-up appointment.  Your next appointment will be scheduled in our new office located at :  [place]  [address] [place]  [place], [place] [address]

## 2014-03-14 NOTE — Assessment & Plan Note (Signed)
We have encouraged her to continue to work on weaning her cigarettes and smoking cessation. She will continue to work on this and does not want any assistance with chantix.  

## 2014-03-14 NOTE — Telephone Encounter (Signed)
Returned pt call left via vm, no answer

## 2014-03-14 NOTE — Progress Notes (Signed)
Patient ID: Erica Poole, female    DOB: Apr 29, 1946, 68 y.o.   MRN: [id number]  HPI Comments: 68 year old female with  history of bilateral PE and left lower extremity DVT in December 2013, who presents for followup.  At the time of her PE, she was sitting at her computer for many hours at a time. seen by Dr. Cynda Acres of hematology. She also has follow-up with Dr. Ronalee Belts for her IVC filter.  In followup today, she reports that she is doing well. She continues to smoke one half pack per day.  She is not taking her anticoagulation on a regular basis, possibly several times per week. She is afraid of the anticoagulation and possible dangers. She denies any chest pain. She does have mild baseline shortness of breath, possibly from smoking.  She does continue to sit at her computer for long hours as she was previously  Previous  echocardiogram showed normal LV function was moderately reduced RV function.  Status post IVC filter  placed at the time of her PE .   EKG today shows normal sinus rhythm with rate 65 beats per minute, no significant ST or T wave changes, left axis deviation  Past Medical History . Hypertension  . COPD (chronic obstructive pulmonary disease)  . Fibrocystic breast disease  . Tobacco abuse    a. quit 04/2012 . Bilateral pulmonary embolism    a. 04/24/2012 -->Started on Xarelto;  b. 04/28/2012 s/p IVC Filter. . Left leg DVT    a. 04/24/2012 -->Started on Xarelto . Polycythemia  . Chronic diastolic CHF (congestive heart failure)    a. 04/2012 Echo: EF 55%, mildly dil RV w/ mod reduced RV fxn, mild bi-atrial enlargement.     Outpatient Encounter Prescriptions as of 03/14/2014  Medication Sig  . albuterol (PROVENTIL) (2.5 MG/3ML) 0.083% nebulizer solution Take 2.5 mg by nebulization every 6 (six) hours as needed for wheezing or shortness of breath.  . allopurinol (ZYLOPRIM) 100 MG tablet Take 100 mg by mouth daily.  . furosemide (LASIX) 20 MG tablet Take 1 tablet  (20 mg total) by mouth daily as needed.  Marland Kitchen ipratropium-albuterol (DUONEB) 0.5-2.5 (3) MG/3ML SOLN Take 3 mLs by nebulization 4 (four) times daily.  Marland Kitchen lisinopril-hydrochlorothiazide (PRINZIDE,ZESTORETIC) 10-12.5 MG per tablet Take 1 tablet by mouth daily.  . potassium chloride (K-DUR) 10 MEQ tablet Take 1 tablet (10 mEq total) by mouth daily as needed.  . SYMBICORT 160-4.5 MCG/ACT inhaler Inhale 2 puffs into the lungs 2 (two) times daily.   Alveda Reasons 20 MG TABS tablet TAKE ONE TABLET BY MOUTH ONCE DAILY    Review of Systems  Constitutional: Negative.   HENT: Negative.   Eyes: Negative.   Respiratory: Negative.   Cardiovascular: Negative.   Gastrointestinal: Negative.   Endocrine: Negative.   Musculoskeletal: Negative.   Skin: Negative.   Allergic/Immunologic: Negative.   Neurological: Negative.   Hematological: Negative.   Psychiatric/Behavioral: Negative.   All other systems reviewed and are negative.   BP 158/78 mmHg  Pulse 65  Ht 4\' 11"  (1.499 m)  Wt 187 lb 12 oz (85.163 kg)  BMI 37.90 kg/m2  Physical Exam  Constitutional: She is oriented to person, place, and time. She appears well-developed and well-nourished.  HENT:  Head: Normocephalic.  Nose: Nose normal.  Mouth/Throat: Oropharynx is clear and moist.  Eyes: Conjunctivae are normal. Pupils are equal, round, and reactive to light.  Neck: Normal range of motion. Neck supple. No JVD present.  Cardiovascular: Normal rate, regular rhythm,  S1 normal, S2 normal, normal heart sounds and intact distal pulses.  Exam reveals no gallop and no friction rub.   No murmur heard. Pulmonary/Chest: Effort normal and breath sounds normal. No respiratory distress. She has no wheezes. She has no rales. She exhibits no tenderness.  Abdominal: Soft. Bowel sounds are normal. She exhibits no distension. There is no tenderness.  Musculoskeletal: Normal range of motion. She exhibits no edema or tenderness.  Lymphadenopathy:    She has no  cervical adenopathy.  Neurological: She is alert and oriented to person, place, and time. Coordination normal.  Skin: Skin is warm and dry. No rash noted. No erythema.  Psychiatric: She has a normal mood and affect. Her behavior is normal. Judgment and thought content normal.    Assessment and Plan  Nursing note and vitals reviewed.

## 2014-03-14 NOTE — Assessment & Plan Note (Signed)
She has follow-up with hematology. Recommended that she stay on her anticoagulation for now as she continues to sit at her computer, relatively sedentary. If she was more compliant with her anticoagulation, could take her IVC filter out. Currently she is not very compliant

## 2014-03-14 NOTE — Assessment & Plan Note (Signed)
Blood pressure is elevated even on recheck. Systolic 962. She is unclear if she is taking lisinopril HCTZ. We will increase this up to lisinopril HCTZ 40/25 mg daily, restart potassium daily

## 2014-03-14 NOTE — Assessment & Plan Note (Signed)
Moderate risk of recurrent DVT given her sedentary lifestyle. Suggested she stay compliant with her anticoagulation. Follow-up with hematology

## 2014-03-14 NOTE — Assessment & Plan Note (Signed)
Mild chronic stable shortness of breath. No recent exacerbations

## 2014-03-14 NOTE — Assessment & Plan Note (Addendum)
She appears relatively euvolemic on today's visit. Medication changes as below

## 2014-03-15 ENCOUNTER — Ambulatory Visit: Payer: Self-pay | Admitting: Internal Medicine

## 2014-03-15 LAB — CANCER CENTER HEMATOCRIT: HCT: 48.7 % — ABNORMAL HIGH (ref 35.0–47.0)

## 2014-04-12 ENCOUNTER — Ambulatory Visit: Payer: Self-pay | Admitting: Internal Medicine

## 2014-05-17 ENCOUNTER — Ambulatory Visit: Payer: Self-pay | Admitting: Internal Medicine

## 2014-05-26 ENCOUNTER — Telehealth: Payer: Self-pay | Admitting: *Deleted

## 2014-05-26 NOTE — Telephone Encounter (Signed)
Spoke w/ pt.  She reports that she has been dizzy for the past approx 2 weeks.  Pt states that she believes sx are r/t lisinopril and would like this dosage changed.  Pt does not check her BP and does not have any readings to report.  Pt reports that she has "inner ear" that presents similarly. She reports that she ran out of her meds for this about 2 weeks ago.  Advised pt to contact her PCP for refill on her inner ear meds, to monitor her BP and call back tomorrow or Monday w/ readings.

## 2014-05-26 NOTE — Telephone Encounter (Signed)
Pt c/o medication issue:  1. Name of Medication: Lisinoprel 10 mg  2. How are you currently taking this medication (dosage and times per day)? Once a day  3. Are you having a reaction (difficulty breathing--STAT)? Breathing fine. Very dizzy feels like she could pass out. Room starts spinning.  4. What is your medication issue?

## 2014-07-05 ENCOUNTER — Ambulatory Visit: Payer: Self-pay | Admitting: Internal Medicine

## 2014-07-12 ENCOUNTER — Ambulatory Visit
Admit: 2014-07-12 | Disposition: A | Discharge status: Home / Self Care | Payer: Self-pay | Attending: Internal Medicine | Admitting: Internal Medicine

## 2014-08-16 ENCOUNTER — Ambulatory Visit
Admit: 2014-08-16 | Disposition: A | Discharge status: Home / Self Care | Payer: Self-pay | Attending: Internal Medicine | Admitting: Internal Medicine

## 2014-08-16 LAB — CANCER CENTER HEMATOCRIT: HCT: 48.9 % — AB (ref 35.0–47.0)

## 2014-08-30 NOTE — H&P (Signed)
PATIENT NAME:  Erica Poole, Erica Poole MR#:  [id number] DATE OF BIRTH:  06-Mar-1946  DATE OF ADMISSION:  04/24/2012  REFERRING PHYSICIAN: Lisa Roca, MD   PRIMARY CARE PHYSICIAN: Halina Maidens, MD   REASON FOR ADMISSION: Progressive shortness of breath with peripheral edema.   HISTORY OF PRESENT ILLNESS: The patient is a 69 year old female with a history of hypertension and chronic obstructive pulmonary disease who presents to the Emergency Room with worsening peripheral edema and shortness of breath. In the Emergency Room, the patient was noted to be hypoxic with peripheral edema. Chest x-ray and BNP were suggestive of congestive heart failure. She denies any previous cardiac history. She was given IV Lasix in the Emergency Room and is now admitted for further evaluation.   PAST MEDICAL HISTORY:  1. Chronic obstructive pulmonary disease/tobacco abuse.  2. Benign hypertension.  3. Fibrocystic breast disease, status post lumpectomy.  4. Status post cholecystectomy.   MEDICATIONS:  1. Advair HFA 2 puffs b.i.d.  2. Ventolin 2 puffs every 4 hours p.r.n. shortness of breath.  3. Tenoretic 100/25, 1 p.o. daily.   ALLERGIES: No known drug allergies.   SOCIAL HISTORY: The patient continues to smoke 1-1/2 packs per day over the past 50 years. No history of alcohol abuse.   FAMILY HISTORY: Positive for hypertension and stroke. Negative for colon cancer, breast cancer, coronary artery disease.   REVIEW OF SYSTEMS: CONSTITUTIONAL: No fever although she has had weight gain. EYES: No blurred or double vision. No glaucoma. ENT: No tinnitus or hearing loss. No nasal discharge or bleeding. No difficulty swallowing. RESPIRATORY: The patient has a mild cough. Denies wheezing or hemoptysis. No painful respiration. CARDIOVASCULAR: No chest pain, but she does have orthopnea. Denies palpitations or syncope. GASTROINTESTINAL: No nausea, vomiting, or diarrhea. No abdominal pain. No change in bowel habits.  GENITOURINARY: No dysuria or hematuria. No incontinence. ENDOCRINE: No polyuria or polydipsia. No heat or cold intolerance. HEMATOLOGIC: The patient denies anemia, easy bruising, or bleeding. LYMPHATIC: No swollen glands. MUSCULOSKELETAL: The patient denies pain in her neck, back, shoulders, knees or hips. No gout. NEUROLOGIC: No numbness or migraines. Denies stroke or seizures. PSYCHIATRIC: The patient denies anxiety, insomnia, or depression.   PHYSICAL EXAMINATION:  GENERAL: The patient is in no acute distress.   VITAL SIGNS: Vital signs are currently remarkable for a blood pressure of 127/62, with a heart rate of 60 and a  respiratory rate of 22. She is afebrile.   HEENT: Normocephalic, atraumatic. Pupils are equally round and reactive to light and accommodation. Extraocular movements are intact. Sclerae are anicteric. Conjunctivae are clear. Oropharynx is clear.   NECK: Supple without jugular venous distention. No adenopathy or thyromegaly is noted.   LUNGS: Decreased breath sounds throughout with basilar rales and scattered wheezes and rhonchi. No dullness.   CARDIAC: Regular rate and rhythm. Normal S1 and S2. No significant rubs, murmurs, or gallops. PMI is nondisplaced. Chest wall is nontender.   ABDOMEN: Soft, nontender with normoactive bowel sounds. No organomegaly or masses were appreciated. No hernias or bruits were noted.   EXTREMITIES: 2+ edema with stasis changes. Pulses were 2+ bilaterally. No clubbing or cyanosis.   SKIN: Warm and dry without rash or lesions.   NEUROLOGIC: Exam revealed cranial nerves II through XII are grossly intact. Deep tendon reflexes were symmetric. Motor and sensory examination is nonfocal.   PSYCHIATRIC: Exam revealed a patient who was alert and oriented to person, place, and time. She was cooperative and used good judgment.   LABORATORY, DIAGNOSTIC  AND RADIOLOGICAL DATA: EKG revealed sinus rhythm with no acute ischemic changes. Chest x-ray revealed  congestive heart failure with questionable right basilar pneumonia.   White count was 7.8 with a hemoglobin of 19.6. Her BNP was 6320.0.  Glucose 92, with a BUN of 35, and creatinine of 0.87 with a GFR of greater than 60. Troponin was 0.02.   ASSESSMENT:  1. New onset congestive heart failure.  2. Questionable pneumonia.  3. Chronic obstructive pulmonary disease exacerbation.  4. Polycythemia.  5. Benign hypertension.   PLAN: Patient will be admitted to the floor with telemetry with IV Lasix, oxygen and low-dose beta blocker therapy. We will also initiate low-dose therapy with an angiotensin receptor blocker. We will supplement potassium with her Lasix. We will wean her oxygen as tolerated. We will follow serial cardiac enzymes and obtain an echocardiogram. We will obtain a cardiology consult because of her new onset congestive heart failure. Follow up chest x-ray and labs in the morning. We will continue her usual chronic obstructive pulmonary regimen.  We will begin empiric antibiotics and recheck her chest x-ray for pneumonia tomorrow after some diuresis has been done. We will also consult hematology/oncology in regards to the patient's polycythemia. Further treatment and evaluation will depend upon the patient's progress.   TOTAL TIME SPENT:  50 minutes.  ____________________________ Leonie Douglas Doy Hutching, MD jds:cbb D: 04/24/2012 17:04:04 ET T: 04/24/2012 18:08:26 ET JOB#: [id number] cc: Leonie Douglas. Doy Hutching, MD, <Dictator> Halina Maidens, MD JEFFREY Lennice Sites MD ELECTRONICALLY SIGNED 04/24/2012 18:35

## 2014-08-30 NOTE — Consult Note (Signed)
Brief Consult Note: Diagnosis: POLYCYTHEMIA, DVT  PE  CHF.   Patient was seen by consultant.   Consult note dictated.   Comments: SEE DICTATED NOTE  GOOD LVF, EDEMA, POSSIBLE RIGHT HEART STRAIN, PE, DISCUSSED WITH DR Rockey Situ.  POLYCYTHEMAI LIKELY SECONDARY, WILL EVALUATE FOR PRIMARY. PE, PROVOKED BY EDEMA, IMMOBILITY, SIGNIFICSNT HX THAT PATIENT SITS FOR HOURS OR ALL NIGHT IMMOBILE AT COMPUTER, ALSO CONTRIBUTING POLYCYTHEMIA, ALSO SMOKER.  PLAN, MEDICINE HAS ORDERED ANTICOAGULATION. WOULD CHECK TUMOR MARKERS, AND ADVISE AGE APPROPRIATE CANCER SCREENING, SO NEED A MAMMOGRAM IS OVERDUE. CHECK IF COLONOSCOPY WITHIN APPROPRIATE INTERVAL. WOULD CHECK LUPUS ANTICOAGULANT AND SOME HYPERCOAGUABLE PARAMETERS.  RECOMMEND THERPEUTIC PHLEBOTOMY INITIAL GOAL GET HCT BELOW 52, THEN DOWN TO 48, AND LOWER IF ANY EVIDENCE OF PRIMARY POLYCYTHEMIA.  Electronic Signatures: Dallas Schimke (MD)  (Signed 14-Dec-13 15:31)  Authored: Brief Consult Note   Last Updated: 14-Dec-13 15:31 by Dallas Schimke (MD)

## 2014-08-30 NOTE — Consult Note (Signed)
General Aspect DVT with unprovoked PE in a patient with minimal pulmonary reserve    Present Illness The patient is a 69 year old female with a history of hypertension and chronic obstructive pulmonary disease on home O2 who presented to the Emergency Room with worsening peripheral edema and shortness of breath. In the Emergency Room, the patient was noted to be hypoxic. Chest x-ray and BNP were suggestive of congestive heart failure. She denies any previous cardiac history. She was given IV Lasix in the Emergency Room.  Subsequent work up showed both DVT and significant PE.  Right sided heart strain is also noted.   PAST MEDICAL HISTORY:  1. Chronic obstructive pulmonary disease/tobacco abuse.  2. Benign hypertension.  3. Fibrocystic breast disease, status post lumpectomy.  4. Status post cholecystectomy.   Home Medications: Medication Instructions Status  Advair HFA 230 mcg-21 mcg/inh inhalation aerosol 1 puff(s) inhaled 2 times a day Active  Ventolin HFA 90 mcg/inh inhalation aerosol 1-4 puff(s) inhaled every 4 hours Active  atenolol-chlorthalidone 100 mg-25 mg oral tablet 1 tab(s) orally once a day Active    No Known Allergies:   Case History:   Family History Non-Contributory    Social History positive  tobacco, positive ETOH, negative Illicit drugs   Review of Systems:   Fever/Chills No    Cough No    Sputum No    Abdominal Pain No    Diarrhea No    Constipation No    Nausea/Vomiting No    SOB/DOE Yes    Chest Pain No    Telemetry Reviewed NSR    Dysuria No   Physical Exam:   GEN well developed, well nourished, ill appearing    HEENT PERRL, hearing intact to voice, poor dentition    NECK supple  trachea midline    RESP normal resp effort  no use of accessory muscles    CARD regular rate  LE edema present  positive JVD    ABD denies tenderness  denies Flank Tenderness  soft    EXTR negative cyanosis/clubbing, positive edema    SKIN No rashes, No  ulcers    NEURO cranial nerves intact, follows commands, motor/sensory function intact    PSYCH alert, poor insight   Nursing/Ancillary Notes: **Vital Signs.:   17-Dec-13 07:20   Vital Signs Type Routine   Temperature Temperature (F) 98.2   Celsius 36.7   Temperature Source oral   Pulse Pulse 65   Respirations Respirations 22   Systolic BP Systolic BP 903   Diastolic BP (mmHg) Diastolic BP (mmHg) 67   Mean BP 80   Pulse Ox % Pulse Ox % 93   Pulse Ox Activity Level  At rest   Oxygen Delivery 5L   Routine Chem:  16-Dec-13 03:34    BUN  34   Creatinine (comp) 0.92   eGFR (African American) >60   eGFR (Non-African American) >60 (eGFR values <84m/min/1.73 m2 may be an indication of chronic kidney disease (CKD). Calculated eGFR is useful in patients with stable renal function. The eGFR calculation will not be reliable in acutely ill patients when serum creatinine is changing rapidly. It is not useful in  patients on dialysis. The eGFR calculation may not be applicable to patients at the low and high extremes of body sizes, pregnant women, and vegetarians.)   Glucose, Serum  104   Sodium, Serum 140   Potassium, Serum 3.8   Chloride, Serum  91   CO2, Serum  45   Calcium (  Total), Serum 9.4   Anion Gap  4   Osmolality (calc) 287   Result Comment CO2 - RESULTS VERIFIED BY REPEAT TESTING.  - NOTIFIED OF CRITICAL VALUE  - C/DANA NEEDHAM AT 3149 04/27/12.PMH  - READ-BACK PROCESS PERFORMED.  Result(s) reported on 27 Apr 2012 at 04:46AM.  17-Dec-13 09:37    BUN  27 (Result(s) reported on 28 Apr 2012 at 11:20AM.)   Creatinine (comp) 1.00   eGFR (African American) >60   eGFR (Non-African American)  58 (eGFR values <61m/min/1.73 m2 may be an indication of chronic kidney disease (CKD). Calculated eGFR is useful in patients with stable renal function. The eGFR calculation will not be reliable in acutely ill patients when serum creatinine is changing rapidly. It is not useful in   patients on dialysis. The eGFR calculation may not be applicable to patients at the low and high extremes of body sizes, pregnant women, and vegetarians.)  Routine Hem:  16-Dec-13 03:34    Hematocrit (CBC)  52.9   Hemoglobin (CBC)  16.8   WBC (CBC)  12.3   RBC (CBC)  6.04   Platelet Count (CBC) 172   MCV 88   MCH 27.9   MCHC  31.8   RDW  19.0   Neutrophil % 88.0   Lymphocyte % 5.3   Monocyte % 6.7   Eosinophil % 0.0   Basophil % 0.0   Neutrophil #  10.9   Lymphocyte #  0.6   Monocyte # 0.8   Eosinophil # 0.0   Basophil # 0.0 (Result(s) reported on 27 Apr 2012 at 04:35AM.)  17-Dec-13 09:37    Hematocrit (CBC) - (Result(s) reported on 28 Apr 2012 at 11:12AM.)   Hematocrit (CBC)  57.9   Hemoglobin (CBC) - (Result(s) reported on 28 Apr 2012 at 11:12AM.)   Hemoglobin (CBC)  18.5   WBC (CBC) 10.7   RBC (CBC)  6.66   Platelet Count (CBC) 203   MCV 87   MCH 27.8   MCHC 32.0   RDW  19.2   Neutrophil % 73.6   Lymphocyte % 17.2   Monocyte % 8.6   Eosinophil % 0.4   Basophil % 0.2   Neutrophil #  7.9   Lymphocyte # 1.8   Monocyte # 0.9   Eosinophil # 0.0   Basophil # 0.0 (Result(s) reported on 28 Apr 2012 at 10:26AM.)    11:10    Hematocrit (CBC)  54.7 (Result(s) reported on 28 Apr 2012 at 11:27AM.)   Hemoglobin (CBC)  17.6 (Result(s) reported on 28 Apr 2012 at 11:27AM.)     Impression 69yr old female with advanced COPD was on home 02, heavy smoker admitted for sob, 1.acute hypoxic respiratory failure due to combination of acute chf,copd exacerbation, PE and acute diastolic heart failure with echo showing RV systolic dysfucntion likely due to PE.  Given her poor pulmonary status and her poor functional status I recommend that she have an IVC filter placed to protect her from lethal PE.  The risks and the benefits as well as the indications were reviewed with the patient and she has agreed to proceed with filter placement. 2. left leg DVT: on Xarelto,d/w benefits and risks  on AC including lifethretening bleeding,pt understands,d/c lovenox,heparin seen by dr,.[place],wean down o2 to see  to keep sats 88-90% 3.COPDexacerbetion;continue o2,may need home o2 again,continue duonebs,spirivawan down steroids ,rocpehin and zithromax,c/o more cough and sob:rpt chest xray today 4.polycythemia, hypercogulable state;seen by oncology,s/p  phlebotomy 3 times,hb trending down  but still gih,tumr markersca19-9,,,CEA ,CA 27.29 are WNL,CA 125 slightly up,JAK mutation pending,normal erythopoitin level, GI and DVT prophlaxis 10.h/o head trauma ,fell of escalator 23monthhs ago,ct head unremarkable,will get MRI  brain for further evaluation    Plan level 4 consult   Electronic Signatures: SHortencia Pilar(MD)  (Signed 17-Dec-13 17:45)  Authored: General Aspect/Present Illness, Home Medications, Allergies, History and Physical Exam, Vital Signs, Labs, Impression/Plan   Last Updated: 17-Dec-13 17:45 by SHortencia Pilar(MD)

## 2014-08-30 NOTE — Consult Note (Signed)
General Aspect 69 year old female with a history of hypertension and chronic obstructive pulmonary disease, long smoking hx,  who presents to the Emergency Room with worsening peripheral edema and shortness of breath over the past two weeks. Cardiology was consulted for SOB, edema, possible CHF.  She reports having SOB, worsening edema for the past two weeks. She has been playing computer games for hours at a time when her sx started. Chair was not very comfortable.Left leg is more swollen than the right, pitting edema of both legs. She denies any previous cardiac history.   In the Emergency Room, the patient was noted to be hypoxic with peripheral edema.  Chest x-ray and BNP were suggestive of congestive heart failure. She was given IV Lasix in the Emergency Room.  PAST MEDICAL HISTORY:  1. Chronic obstructive pulmonary disease/tobacco abuse.  2. Benign hypertension.  3. Fibrocystic breast disease, status post lumpectomy.  4. Status post cholecystectomy.   MEDICATIONS:  1. Advair HFA 2 puffs b.i.d.  2. Ventolin 2 puffs every 4 hours p.r.n. shortness of breath.  3. Tenoretic 100/25, 1 p.o. daily.   ALLERGIES: No known drug allergies.   SOCIAL HISTORY: The patient continues to smoke 1-1/2 packs per day over the past 50 years. No history of alcohol abuse. Has a boyfriend  FAMILY HISTORY: Positive for hypertension and stroke. Negative for colon cancer, breast cancer, coronary artery disease.   Physical Exam:   GEN well developed, well nourished, no acute distress    HEENT red conjunctivae    NECK supple    RESP normal resp effort  no use of accessory muscles  on oxygen    CARD Regular rate and rhythm  No murmur    ABD denies tenderness  soft    SKIN normal to palpation    NEURO motor/sensory function intact    PSYCH alert, A+O to time, place, person, good insight   Review of Systems:   Subjective/Chief Complaint SOB, cough (mild), wheezing    General: Weakness     Skin: No Complaints    ENT: No Complaints    Eyes: No Complaints    Neck: No Complaints    Respiratory: Short of breath  Wheezing    Cardiovascular: Dyspnea  Edema    Gastrointestinal: No Complaints    Genitourinary: No Complaints    Vascular: No Complaints    Musculoskeletal: No Complaints    Neurologic: No Complaints    Hematologic: No Complaints    Endocrine: No Complaints    Psychiatric: No Complaints    Review of Systems: All other systems were reviewed and found to be negative    Medications/Allergies Reviewed Medications/Allergies reviewed     HTN:    COPD:    Cholecystectomy:    Lumpectomy:        Admit Diagnosis:   CHF, ACUTE RESP FAILURE: 25-Apr-2012, Active, CHF, ACUTE RESP FAILURE      Admit Reason:   Polycythemia: (289.0) Active, ICD9, Polycythemia, secondary   COPD exacerbation: (491.21) Active, ICD9, Obstructive chronic bronchitis with exacerbation   CHF (congestive heart failure): (428.0) Active, ICD9, Congestive heart failure, unspecified   Acute respiratory failure: (518.81) Active, ICD9, Acute respiratory failure  Home Medications: Medication Instructions Status  Advair HFA 230 mcg-21 mcg/inh inhalation aerosol 1 puff(s) inhaled 2 times a day Active  Ventolin HFA 90 mcg/inh inhalation aerosol 1-4 puff(s) inhaled every 4 hours Active  atenolol-chlorthalidone 100 mg-25 mg oral tablet 1 tab(s) orally once a day Active   Lab Results:  Routine Chem:  14-Dec-13 00:21    Result Comment CO2 - RESULTS VERIFIED BY REPEAT TESTING.  - C/BYRON HADDOCK/0140/04-25-12/[facility]WW  - NOTIFIED OF CRITICAL VALUE  - READ-BACK PROCESS PERFORMED.  Result(s) reported on 25 Apr 2012 at 01:02AM.   Glucose, Serum  162   BUN  36   Creatinine (comp) 0.99   Sodium, Serum 144   Potassium, Serum 3.8   Chloride, Serum 98   CO2, Serum  40   Calcium (Total), Serum 9.1   Anion Gap  6   Osmolality (calc) 299   eGFR (African American) >60   eGFR (Non-African  American)  59 (eGFR values <17m/min/1.73 m2 may be an indication of chronic kidney disease (CKD). Calculated eGFR is useful in patients with stable renal function. The eGFR calculation will not be reliable in acutely ill patients when serum creatinine is changing rapidly. It is not useful in  patients on dialysis. The eGFR calculation may not be applicable to patients at the low and high extremes of body sizes, pregnant women, and vegetarians.)  Cardiac:  14-Dec-13 00:21    CK, Total 23   CPK-MB, Serum 1.1 (Result(s) reported on 25 Apr 2012 at 01:19AM.)   Troponin I < 0.02 (0.00-0.05 0.05 ng/mL or less: NEGATIVE  Repeat testing in 3-6 hrs  if clinically indicated. >0.05 ng/mL: POTENTIAL  MYOCARDIAL INJURY. Repeat  testing in 3-6 hrs if  clinically indicated. NOTE: An increase or decrease  of 30% or more on serial  testing suggests a  clinically important change)    06:49    CK, Total 25   CPK-MB, Serum 1.3 (Result(s) reported on 25 Apr 2012 at 07:22AM.)   Troponin I < 0.02 (0.00-0.05 0.05 ng/mL or less: NEGATIVE  Repeat testing in 3-6 hrs  if clinically indicated. >0.05 ng/mL: POTENTIAL  MYOCARDIAL INJURY. Repeat  testing in 3-6 hrs if  clinically indicated. NOTE: An increase or decrease  of 30% or more on serial  testing suggests a  clinically important change)  Routine Hem:  14-Dec-13 00:21    WBC (CBC) 6.8   RBC (CBC)  6.70   Hemoglobin (CBC)  19.1   Hematocrit (CBC)  59.1   Platelet Count (CBC)  143   MCV 88   MCH 28.5   MCHC 32.3   RDW  18.8   Neutrophil % 90.7   Lymphocyte % 7.9   Monocyte % 1.0   Eosinophil % 0.0   Basophil % 0.4   Neutrophil # 6.1   Lymphocyte #  0.5   Monocyte #  0.1   Eosinophil # 0.0   Basophil # 0.0 (Result(s) reported on 25 Apr 2012 at 01:02AM.)   EKG:   Interpretation EKG shows NSR with old Anterior MI, anterolateral and inferior T wave ABN, unable to exclude inferior MI    No Known Allergies:   Vital Signs/Nurse's  Notes: **Vital Signs.:   14-Dec-13 03:54   Vital Signs Type Routine   Temperature Temperature (F) 98.2   Celsius 36.7   Temperature Source Oral   Pulse Pulse 57   Respirations Respirations 20   Systolic BP Systolic BP 99   Diastolic BP (mmHg) Diastolic BP (mmHg) 59   Mean BP 72   Pulse Ox % Pulse Ox % 90   Pulse Ox Activity Level  At rest   Oxygen Delivery 5L     Impression 69yr old female with h/o COPD,heavy smoker admitted for sob, edema.  1.acute hypoxic respiratory failure   From b/l  PE (left LE DVT) (sitting in computer chair playing games for hours) .acute diastolic CHF/suspect pulmonary HTN from PE :pending echo, clinically  improving   2.ACUTE b/l PE and left leg DVT: on Xarelto 15 mg po BID, lovenox held No elevation of cardiac enz is encouraging (no cardiac stretch)  3.COPDexacerbetion; on duonebs,spiriva,solumedrol ,rocpehin and zithromax  4.polycythemia Possible hypercogulable state; sitting for long periods playing computer games ("4 hours at a time") oncology consult requested by hospitalist   Electronic Signatures: Ida Rogue (MD)  (Signed 14-Dec-13 12:51)  Authored: General Aspect/Present Illness, History and Physical Exam, Review of System, Past Medical History, Health Issues, Home Medications, Labs, EKG , Allergies, Vital Signs/Nurse's Notes, Impression/Plan   Last Updated: 14-Dec-13 12:51 by Ida Rogue (MD)

## 2014-08-30 NOTE — Consult Note (Signed)
Chief Complaint:   Subjective/Chief Complaint NO ACUTE COMPLAINTS. NO DISCOMFORT IV SITE. STRONGER BREATHING BETTER. NO CP OR HEADACHE OR DIZZINESS... GIVES NEW HX RE CHRONIC UNSTEADY GAIT, SEE IMP AND PLAN   VITAL SIGNS/ANCILLARY NOTES: **Vital Signs.:   16-Dec-13 11:30   Vital Signs Type Routine   Temperature Temperature (F) 97.4   Celsius 36.3   Temperature Source oral   Pulse Pulse 65   Respirations Respirations 18   Systolic BP Systolic BP 078   Diastolic BP (mmHg) Diastolic BP (mmHg) 69   Mean BP 83   Pulse Ox % Pulse Ox % 90   Pulse Ox Activity Level  At rest   Oxygen Delivery 5L   Brief Assessment:   Respiratory clear BS  no use of accessory muscles    Gastrointestinal details normal Nontender    Additional Physical Exam ALERT AND COOPERATIVE, NEURO GROSSLY NON FOCAL, GAIT NOT TESTED, TRACE/1 PLUS LOWER EXT EDEMA   Lab Results: Routine Chem:  16-Dec-13 03:34    Glucose, Serum  104   BUN  34   Creatinine (comp) 0.92   Sodium, Serum 140   Potassium, Serum 3.8   Chloride, Serum  91   CO2, Serum  45   Calcium (Total), Serum 9.4   Anion Gap  4   Osmolality (calc) 287   eGFR (African American) >60   eGFR (Non-African American) >60 (eGFR values <23m/min/1.73 m2 may be an indication of chronic kidney disease (CKD). Calculated eGFR is useful in patients with stable renal function. The eGFR calculation will not be reliable in acutely ill patients when serum creatinine is changing rapidly. It is not useful in  patients on dialysis. The eGFR calculation may not be applicable to patients at the low and high extremes of body sizes, pregnant women, and vegetarians.)   Result Comment CO2 - RESULTS VERIFIED BY REPEAT TESTING.  - NOTIFIED OF CRITICAL VALUE  - C/DANA NEEDHAM AT [id number]/16/13.PMH  - READ-BACK PROCESS PERFORMED.  Result(s) reported on 27 Apr 2012 at 04:46AM.  Routine Hem:  16-Dec-13 03:34    WBC (CBC)  12.3   RBC (CBC)  6.04   Hemoglobin (CBC)  16.8    Hematocrit (CBC)  52.9   Platelet Count (CBC) 172   MCV 88   MCH 27.9   MCHC  31.8   RDW  19.0   Neutrophil % 88.0   Lymphocyte % 5.3   Monocyte % 6.7   Eosinophil % 0.0   Basophil % 0.0   Neutrophil #  10.9   Lymphocyte #  0.6   Monocyte # 0.8   Eosinophil # 0.0   Basophil # 0.0 (Result(s) reported on 27 Apr 2012 at 04:35AM.)   Assessment/Plan:  Assessment/Plan:   Assessment SEE ALSO EARLIER NOTES. DVT, PE, PRECIPITATING EVENT IMMOBILITY. TUMOR MARKERS PENDING. LATER ADD HYPERCOAGUABLE W/U. HAVE ADVISED AGE APPROPRIATE CANCER SCREENING. COPD AND CHF, MANAGE AS PER MEDICINE AND CARDIOLOGY.  POLYCYTHEMIA WITH EVIDENCE FOR SECONDARY TYPE, JAK AND EPO LEVEL  PENDING.   TODAY, EXAM STABLE. IS S/P THERAPEUTIC PHLEBOTOMY 12/14 AND 12/15, HGB SLOWLY BETTER BUT STILL ELEVATED.  ALSO, NEW HX, PATIENT STATES, AND SHE HAS NOT TOLD OTHER PROVIDERS, THAT SINCE HEAD INJURY IN JULY, SHE STUMBLES AND FALLS TO THE RIGHT SIDE, BUT NO WEAKNESS RIGHT ORLEFT SIDE AND NO DIZZINESS    Plan 1. THERAPEUTIC PHLEBOTOMY TODAY 350 CC.  F/U HCT DAILY. FOLLOW MULTIPLE OTHER RESULTS.  2. SUGGEST MRI BRAIN WITH HX ABOVE, AND NEUROLOGY CONSULTATION  Electronic Signatures: Dallas Schimke (MD)  (Signed 16-Dec-13 11:45)  Authored: Chief Complaint, VITAL SIGNS/ANCILLARY NOTES, Brief Assessment, Lab Results, Assessment/Plan   Last Updated: 16-Dec-13 11:45 by Dallas Schimke (MD)

## 2014-08-30 NOTE — Consult Note (Signed)
Chief Complaint:   Subjective/Chief Complaint NO ACUTE COMPLAINTS, SOME BILATERAL LEG ACHE, PATIENT STATES THIS IS CHRONIC, NO CP, NOT SOB AT REST, EDEMA BETTER   VITAL SIGNS/ANCILLARY NOTES: **Vital Signs.:   15-Dec-13 07:31   Temperature Temperature (F) 98   Celsius 36.6   Pulse Pulse 64   Respirations Respirations 20   Systolic BP Systolic BP 329   Diastolic BP (mmHg) Diastolic BP (mmHg) 63   Mean BP 78   Pulse Ox % Pulse Ox % 94   Pulse Ox Activity Level  At rest   Oxygen Delivery 5L   Oxygen Delivery Adjusted To (RN or RCP Only)  4L   Brief Assessment:   Respiratory clear BS  no use of accessory muscles  DECREASED AIR ENTRY    Gastrointestinal details normal Nontender    Additional Physical Exam NEURO GROSSLY NON FOCAL, LOWER EXT MINIMAL EDEMA   Lab Results: Routine Hem:  15-Dec-13 03:11    WBC (CBC) 10.9   RBC (CBC)  6.16   Hemoglobin (CBC)  17.5   Hematocrit (CBC)  54.2   Platelet Count (CBC) 163   MCV 88   MCH 28.4   MCHC 32.3   RDW  18.9   Neutrophil % 93.6   Lymphocyte % 3.8   Monocyte % 2.2   Eosinophil % 0.0   Basophil % 0.4   Neutrophil #  10.2   Lymphocyte #  0.4   Monocyte # 0.2   Eosinophil # 0.0   Basophil # 0.0 (Result(s) reported on 26 Apr 2012 at 04:04AM.)   Assessment/Plan:  Assessment/Plan:   Assessment AS PER MEDICINE, COPD, PNEUMONIA, CHF, DVT.  FROM HEME POINT OF VIEW DVT PROVOKED BY IMMOBILITY, PLUS EDEMA, CHF, SMOKING.  POLYCYTHEMIA WITH HCT AT ONE POINT OVER 60, WBC AND PLTS APPEAR NORMAL,  CLEARLY HYPOXIA IS POSSIBLE SECONDARY CAUSE, EVALUATING FOR P VERA. HCT DOWN TO 54.5 AFTER THERAPEUTIC PHLEBOTOMY LAST EVENING    Plan ORDERED THERAPEUTIC PHLEBOTOMY 350 CC NOW. SERIAL CBC. F/U EPO LEVEL AND JAK MUTATION AND LUPUS INHIBITOR, LATER MAY NEED ADDITIONAL STUDIES, POSSIBLE ROLE IN IRON STUDIES, U/S FOR SPLEEN SIZE, ADDITIONAL HYPERCOAGUABLE TESTS, DEPENDING ON INITIAL RESULTS   Electronic Signatures: Dallas Schimke (MD)  (Signed  15-Dec-13 10:50)  Authored: Chief Complaint, VITAL SIGNS/ANCILLARY NOTES, Brief Assessment, Lab Results, Assessment/Plan   Last Updated: 15-Dec-13 10:50 by Dallas Schimke (MD)

## 2014-08-30 NOTE — Consult Note (Signed)
Chief Complaint:   Subjective/Chief Complaint NO ACUTE COMPLAINTS NO CP OR HEADACHE OR DIZZINESS.Marland KitchenSEEN AFTER PROCEEDURE EARLIER THIS EVENING...   VITAL SIGNS/ANCILLARY NOTES: **Vital Signs.:   17-Dec-13 19:32   Vital Signs Type Routine   Temperature Temperature (F) 97.8   Celsius 36.5   Temperature Source tympanic   Pulse Pulse 71   Respirations Respirations 18   Systolic BP Systolic BP 95   Diastolic BP (mmHg) Diastolic BP (mmHg) 60   Mean BP 71   Pulse Ox % Pulse Ox % 91   Pulse Ox Activity Level  At rest   Oxygen Delivery 5L   Brief Assessment:   Respiratory clear BS  no use of accessory muscles    Gastrointestinal details normal Nontender    Additional Physical Exam ALERT AND COOPERATIVE, NEURO GROSSLY NON FOCAL,   Lab Results: Routine Chem:  17-Dec-13 09:37    BUN  27 (Result(s) reported on 28 Apr 2012 at 11:20AM.)   Creatinine (comp) 1.00   eGFR (African American) >60   eGFR (Non-African American)  58 (eGFR values <62m/min/1.73 m2 may be an indication of chronic kidney disease (CKD). Calculated eGFR is useful in patients with stable renal function. The eGFR calculation will not be reliable in acutely ill patients when serum creatinine is changing rapidly. It is not useful in  patients on dialysis. The eGFR calculation may not be applicable to patients at the low and high extremes of body sizes, pregnant women, and vegetarians.)  Routine Hem:  17-Dec-13 09:37    Hematocrit (CBC) - (Result(s) reported on 28 Apr 2012 at 11:12AM.)   Hematocrit (CBC)  57.9   Hemoglobin (CBC) - (Result(s) reported on 28 Apr 2012 at 11:12AM.)   Hemoglobin (CBC)  18.5   WBC (CBC) 10.7   RBC (CBC)  6.66   Platelet Count (CBC) 203   MCV 87   MCH 27.8   MCHC 32.0   RDW  19.2   Neutrophil % 73.6   Lymphocyte % 17.2   Monocyte % 8.6   Eosinophil % 0.4   Basophil % 0.2   Neutrophil #  7.9   Lymphocyte # 1.8   Monocyte # 0.9   Eosinophil # 0.0   Basophil # 0.0 (Result(s)  reported on 28 Apr 2012 at 10:26AM.)    11:10    Hematocrit (CBC)  54.7 (Result(s) reported on 28 Apr 2012 at 11:27AM.)   Hemoglobin (CBC)  17.6 (Result(s) reported on 28 Apr 2012 at 11:27AM.)   Assessment/Plan:  Assessment/Plan:   Assessment SEE ALSO EARLIER NOTES. DVT, PE, PRECIPITATING EVENT IMMOBILITY. TUMOR MARKERS PENDING. LATER ADD HYPERCOAGUABLE W/U. HAVE ADVISED AGE APPROPRIATE CANCER SCREENING. COPD AND CHF, MANAGE AS PER MEDICINE AND CARDIOLOGY.  POLYCYTHEMIA WITH EVIDENCE FOR SECONDARY TYPE, JAK AND EPO LEVEL  PENDING.    IS S/P THERAPEUTIC PHLEBOTOMY 12/14 AND 12/15, HGB SLIGHTLY BETTER BUT STILL ELEVATED TODAY, FIRST RESULT THIS AM SPURRIOUS BUT HCT STILL HIGH.  LIKELY DIURESIS IS CONTRIBUTING   NOT REVIEWED TODAY, BUT AS PRIOR NOTED THAT SINCE HEAD INJURY IN JULY, SHE STUMBLES AND FALLS TO THE RIGHT SIDE, BUT NO WEAKNESS RIGHT OR LEFT SIDE AND NO DIZZINESS..Marland KitchenHIS HAS BEEN DISCUSSED WITH MEDICINE AND IMAGING BEING PURSUED. TODAY FILTER PLACED AS PER VASCULAR AND PULMONARY    Plan 1. THERAPEUTIC PHLEBOTOMY TODAY 350 CC DONE. .  F/U HCT DAILY. FOLLOW MULTIPLE OTHER RESULTS.  2. SUGGEST MRI BRAIN WITH HX ABOVE, AND NEUROLOGY CONSULTATION  3. IF NO SIGNIFICANT LVF CONSIDER REDUCING LASIX, WATCH BUN/CRE,  SOME REHYDRATION WOULD REDUCE HCT AND THEN CAN STOP PHLEBOTOMY   Electronic Signatures: Dallas Schimke (MD)  (Signed 17-Dec-13 21:48)  Authored: Chief Complaint, VITAL SIGNS/ANCILLARY NOTES, Brief Assessment, Lab Results, Assessment/Plan   Last Updated: 17-Dec-13 21:48 by Dallas Schimke (MD)

## 2014-08-30 NOTE — Op Note (Signed)
PATIENT NAME:  Poole, Erica MR#:  [id number] DATE OF BIRTH:  1945-10-26  DATE OF PROCEDURE:  04/28/2012  PREOPERATIVE DIAGNOSES: 1. Lower extremity deep vein thrombosis.  2. Pulmonary embolism.  3. Advanced chronic obstructive pulmonary disease with home oxygen.  4. Congestive heart failure.  5. Generalized deconditioning with immobility.   POSTOPERATIVE DIAGNOSES:  1. Lower extremity deep vein thrombosis.  2. Pulmonary embolism.  3. Advanced chronic obstructive pulmonary disease with home oxygen.  4. Congestive heart failure.  5. Generalized deconditioning with immobility.   PROCEDURES PERFORMED: 1. Inferior venacavogram.  2. Placement of infrarenal inferior vena caval filter, Meridian type.  3. Perclose closure of venotomy.   SURGEON: Hortencia Pilar, M.D.   SEDATION: Versed 3 mg administered IV. Continuous ECG, pulse oximetry and cardiopulmonary monitoring was performed throughout the entire procedure by the interventional radiology nurse. Total sedation time was 45 minutes.   ACCESS: A 10 French sheath, right common femoral vein.   CONTRAST USED: Isovue 15 mL.   FLUOROSCOPY TIME: 0.5 minutes.   INDICATIONS: Ms. Erica Poole is a 69 year old woman who presented to the hospital several days ago with profound hypoxia. Workup has demonstrated a DVT in the lower extremity with associated PE. There is also right heart strain and congestive heart failure. The patient is also noted to have advanced COPD. She is on home oxygen therapy and is continuing to smoke. She has significant risk factors for PE secondary to generalized deconditioning and a very sedentary behavior at this time. Given the significant insult she sustained from her PE and the poor lung function she has a baseline, as well as her immobility, weakness and falls that she has had at home, there are concerns regarding maximal protection from further PE, as well as the ability to sustain appropriate anticoagulation. She is  therefore undergoing evaluation for filter placement. The risks and benefits have been reviewed in great detail with the patient, all questions have been answered and the patient agrees to proceed.   DESCRIPTION OF PROCEDURE: The patient is taken to special procedures and placed in the supine position. After adequate sedation is achieved, ultrasound is placed in a sterile sleeve. The right common femoral vein is identified. It is echolucent and compressible indicating patency. 1% lidocaine is infiltrated in the soft tissue. Image of the vein is recorded for the permanent record and under real time visualization Seldinger needle is inserted into the common femoral vein. J-wire is then advanced. 6 French sheath is placed. A Perclose device is then exchanged for the sheath and a Perclose maneuver is performed without difficulty. Wire is then reintroduced and the delivery sheath is advanced over the wire and positioned at the confluence of the iliac veins. AP projection with a bolus of contrast of the inferior vena cava is then performed. There is reflux of contrast into the left renal vein. There are no obstructing lesions and the cava measures approximately 20 mm in diameter. The sheath is then repositioned over the wire and the Meridian filter deployed, at the level of L3, just under the renal vein. The sheath is removed and the Perclose device secured and excellent hemostasis is achieved. Sterile dressing is applied.   INTERPRETATION: AP views of the inferior vena cava demonstrate an approximate 20 mm vena cava left with reflux into the renal vein as noted and the Meridian filter is successfully deployed with good orientation just below this level. ____________________________ Katha Cabal, MD ggs:sb D: 04/28/2012 17:31:38 ET T: 04/29/2012 09:48:43 ET JOB#: [id number]  cc: Katha Cabal, MD, <Dictator> Herbon E. Raul Del, MD Halina Maidens, MD Simonne Come. Inez Pilgrim, MD Minna Merritts, MD Katha Cabal MD ELECTRONICALLY SIGNED 04/29/2012 16:24

## 2014-09-02 NOTE — Discharge Summary (Signed)
PATIENT NAME:  Erica Poole, Erica Poole DATE OF BIRTH:  Feb 02, 1946  ADMITTING DIAGNOSES:   1.  New onset of congestive heart failure.  2.  Questionable pneumonia.  3.  Chronic obstructive pulmonary disease exacerbation.  4.  Polycythemia.  5.  Benign hypertension.   DISCHARGE DIAGNOSES:   1.  Acute respiratory failure.  2.  Congestive heart failure, diastolic.  3.  Pulmonary embolism.  4.  Chronic obstructive pulmonary disease exacerbation.  5.  Acute bronchitis.  6.  Left lower extremity deep vein thrombosis status post inferior vena cava filter placement on 04/28/2012.  7.  Polycythemia, secondary type, status post 4 therapeutic phlebotomies.  8.  Generalized weakness.  9.  Hypertension.   DISCHARGE CONDITION:  Fair.   DISCHARGE MEDICATIONS:   1. The patient is to start Advair HFA  230 /21 mcg 1 puff twice daily.  2.  Ventolin HFA 90 mcg inhalation aerosol 1 to 4 puffs every 4 hours as needed.  3.  Rivaroxaban (Xarelto) 15 mg p.o. twice daily with meals for 14 more days to complete 21-day course.  4.  Xarelto 20 mg p.o. daily after this loading dose of 15 mg twice-daily dose is completed.  5.  Tiotropium 18 mcg inhalation daily.  6.  Guaifenesin 600 mg p.o. twice daily.  7.  Levofloxacin 250 mg p.o. daily for 5 more days.  8.  Nicotine 14 mg transdermal film 1 patch transdermally once a day.  9.  Prednisone 10 mg tablets, 5 tablets once a day to start and then taper by 10 mg every 2 days until discontinued.  10.  DuoNebs 2.5/0.5 mg in 3 mL inhalation solution 3 mL 4 times daily.  11.  Lasix 20 mg p.o. daily.  12.  Potassium chloride 8 mEq 2 capsules once a day.   HOME OXYGEN:  None.   DIET:  2 g salt, low fat, low cholesterol, consistency regular.   ACTIVITY LIMITATIONS:  As tolerated.   REFERRALS:  Physical therapy aide as well as R.N. via home health.   FOLLOWUP APPOINTMENTS:  With Dr. Army Melia in 2 days after discharge.   CONSULTANTS:   1.  Care management.  2.  Dr.  Raul Del.  3.  Dr. Delana Meyer.  4.  Dr. Inez Pilgrim.   5.  Dr. Rockey Situ.   RADIOLOGIC STUDIES:   1.  Chest x-ray, 1 view, April 24, 2012, showed findings consistent with congestive heart failure. Superimposed pneumonia cannot be excluded according to the radiologist.  2.  Bilateral lower extremity Dopplers on April 24, 2012, showed nonocclusive thrombus in deep vein system of left leg, in popliteal vein as well as distal femoral vein.  3.  CT scan of chest April 25, 2012, revealed pulmonary emboli in the medial basal segment of right lower lobe and nonocclusive thrombus in left lower lobe pulmonary artery. Thoracic aorta shows no aneurysm or dissection, but does show atelectasis bilaterally; minimal infiltrate is felt to be less likely. There is a trace left pleural effusion suggested and even small amount of pleural fluid is seen on the right.  4.  CT of head without contrast on April 27, 2012, showed no evidence of acute ischemic or hemorrhagic infarction. There is no evidence of acute intracranial hemorrhage. There is no intracranial mass effect or hydrocephalus. Subtle decrease density in deep gray-white matter in both cerebral hemispheres is consistent with chronic small vessel ischemia. The observed portions of paranasal sinuses and mastoids air cells are clear.  5.  Chest portable, single view, on  April 28, 2012, revealing hyperinflation consistent with COPD, some linear fibrosis or discoid atelectasis near the left lung base laterally noted.  6.  MRI of brain without contrast on April 29, 2012, showed small vessel white matter ischemic changes without evidence of focal or acute abnormalities.   HOSPITAL COURSE:  The patient is a 69 year old Caucasian female whose past medical history is significant for history of COPD, hypertension, who presented to the hospital with complaints of progressive shortness of breath as well as peripheral edema. Please refer to Dr. Doy Hutching' admission note on  April 24, 2012. She was noted to be hypoxic and had peripheral edema. The patient'Poole x-ray, as well as BNP, were suggestive of congestive heart failure. She was admitted to the hospital with diagnosis of congestive heart failure. She was started on diuresis, oxygen as well as low dose of beta-blocker therapy. She was also initiated on ACE inhibitor and her cardiac enzymes were followed as well as echocardiogram was obtained and cardiology consultation. The patient'Poole echocardiogram revealed normal ejection fraction of more than 55%. Left ventricular systolic function was found to be normal. The right ventricle was mildly dilated. Right ventricle systolic function was moderately reduced. The left atrium was mildly dilated, and the right atrium was mildly dilated. Because of lower extremity swelling, the patient underwent ultrasound of her lower extremities, which revealed DVT in the left lower extremity. Because of shortness of breath, she had a CT scan of her chest to evaluate for possible pulmonary embolism, which was detected in the right lower lobe as well as left lower lobe. The patient was evaluated by Dr. Delana Meyer who placed IVC filter on April 28, 2012. The patient was evaluated by Dr. Raul Del and was treated for COPD exacerbation, acute bronchitis and congestive heart failure, as well as pulmonary embolism. With his triple therapy, the patient'Poole condition significantly improved. She was on high oxygen initially on arrival to the hospital; however, her oxygen was weaned off to just room air upon discharge to the hospital. On the day of discharge, the patient'Poole oxygenation is good. She is oxygenating at 91% to 92% on room air at rest. She, however, desaturated to 88% on April 30, 2012, on exertion, which was addressed as well. It was felt that the patient would benefit from oxygen therapy at home, especially on exertion, which was prescribed for her upon discharge.   The patient is to continue loading  dose of Xarelto for her pulmonary embolism as well as therapy for COPD exacerbation with antibiotics as well as tapering doses of steroids. She is to continue also Lasix. Unfortunately, the patient was not able to be started on ACE inhibitor because of her hypotension. It is recommended to follow the patient'Poole blood pressure readings as outpatient and make decisions about initiation of ACE inhibitors as an outpatient, as we were not able to introduce ACE inhibitors due to hypotension. She was advised to weigh herself daily and call physician if she gains weight for advanced doses of medications. She was also counseled about nicotine abuse and nicotine replacement therapy was initiated. The patient was also noted to be polycythemic, which was felt to be polycythemia of secondary cause. Dr. Inez Pilgrim evaluated the patient and recommended therapeutic phlebotomy. The patient underwent 4 therapeutic phlebotomies and her hemoglobin level improved. On the day of admission, April 24, 2012, the patient'Poole hemoglobin level was 19.6 with hematocrit of 62.0. After 4 phlebotomies, the patient'Poole hemoglobin is 17.3 and her most recent hematocrit was 51.7. The patient was advised  to continue followup with her primary care physician and resume her phlebotomies as outpatient to maintain her hematocrits around 46. She is to follow up with Dr. Inez Pilgrim as an outpatient, as well.   The patient had serologic studies done such as CA27-29, which was normal at 14.4 and CA19-9 was normal at 16. Vitamin B12 level was found to be normal at 392. The patient'Poole CA125 level was high at 43.4. Erythropoietin level was normal at 7.8. The patient'Poole leukocyte and alkaline phosphatase level was pending. Lupus anticoagulant with reflex were noted to be normal. The patient'Poole JAK2 was found to be negative. It was felt that the patient had nonspecific and secondary elevation of her hemoglobin as well as hematocrit due to underlying COPD and therapeutic  phlebotomies were recommended by Dr. Inez Pilgrim. The patient is to follow up with Dr. Inez Pilgrim for further recommendations in the future, as well.   The patient is being discharged in stable condition with the above-mentioned medications and followup. Her vital signs on the day of discharge: Temperature 98.3, pulse 71, respiratory rate 18, blood pressure 102/66. Saturation was 91% on room air at rest.   TIME SPENT:  40 minutes.   ____________________________ Theodoro Grist, MD rv:si D: 05/01/2012 15:53:00 ET T: 05/03/2012 15:09:52 ET JOB#: [id number]  cc: Halina Maidens, MD Theodoro Grist, MD, <Dictator>   [place] MD ELECTRONICALLY SIGNED 05/24/2012 13:49

## 2014-09-02 NOTE — Consult Note (Signed)
PATIENT NAME:  Erica Poole, Erica Poole MR#:  [id number] DATE OF BIRTH:  [birth year]  DATE OF CONSULTATION:  04/25/2012  CONSULTING PHYSICIAN:  Simonne Come. Gittin, MD  REASON FOR CONSULTATION AND HISTORY: Erica Poole is a 69 year old patient who was admitted with exacerbation of chronic obstructive pulmonary disease and progressive lower extremity edema, shortness of breath, some evidence of congestive heart failure. She had a negative previous cardiac history. She was treated with Lasix in the Emergency Room and was admitted for evaluation. On cardiology evaluation, the patient had an echocardiogram done. She underwent Dopplers of the lower extremities and showed a deep vein thrombosis. She had a CT of the chest with contrast that has demonstrated pulmonary embolism. She was given a dose of Lovenox early this morning but has been converted now and will be starting later on Xarelto. Notably, the patient gives a history of increasing lower extremity edema, increasing immobility, and a significant history of immobility. She admits that she sits for six or more hours at a time playing video games. She admits that she has sat up all night at a computer, immobile, playing video games, likely the precipitating cause of her clot. The patient does not have any leg pain. She denies headache, dizziness. No current chills or sweats, but was increasingly short of breath; and that has been relieved now with oxygen and Lasix. No palpitations or retrosternal chest pain. No ear or jaw pain. Some nonproductive cough. No wheezing. No hemoptysis. She has a history of orthopnea. No nausea, vomiting, diarrhea, change in bowel habits. No dysuria or hematuria. No polyuria or polydipsia. No history of bleeding or bruising. No prior history of blood disorders. No bone pain. No focal weakness. No history of anxiety or depression. She does has a history of a cholecystectomy, also history of hypertension and fibrocystic breast disease.    MEDICATIONS: She has been on Advair inhalers, and Ventolin inhalers and Tenoretic.   ALLERGIES: No known allergies.   SOCIAL HISTORY: She smokes 1-1/2 packs a day. No alcohol.   FAMILY HISTORY: Sister who was a smoker had lung cancer. No other history of malignancy. No history of clotting.   PHYSICAL EXAMINATION:  GENERAL: Alert, cooperative, in no acute distress.   HEENT: Sclerae no jaundice.  LYMPH: No palpable lymph nodes in the neck, supraclavicular, submandibular, or axilla.   LUNGS: Decreased air entry. No current wheezing. There are some scattered rhonchi.   HEART: Regular.   ABDOMEN: Nontender, no palpable mass or organomegaly.   EXTREMITIES: Have 2+ edema and not tender. No rash.   NEUROLOGIC: Grossly nonfocal. Cranial nerves are intact. Moving all extremities against gravity. I didn't test the gait.   PSYCHIATRIC: Alert and oriented and cooperative.   LABORATORY, DIAGNOSTIC AND RADIOLOGICAL DATA: Chest x-ray showed congestive heart failure. There is possible basilar pneumonia. The patient was started on antibiotics. EKG had a sinus rhythm. On admission, white count was 7.8, hemoglobin 19.6, and the creatinine was normal.   IMPRESSION: The patient has a blood clot and has been started on anticoagulation, looks like precipitating cause is severe immobility and edema. No family history of clots. The patient also has polycythemia. There is clear evidence of hypoxia that would be a cause of secondary, but we still would have to rule out primary.   PLAN: Anticoagulation has already been initiated by Medicine. I will check the lupus comprehensive panel and then later ad other hypercoagulable tests. I would check tumor markers for completeness, otherwise advise age-appropriate cancer screening, although the  patient has had mammograms over the years. She should have her mammogram, and we will check the date. She seems to believe that she is up to date with a colonoscopy within 10  years.   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 With hematocrit at current high level, the     patient should today have a therapeutic phlebotomy. Initial goal is to get hematocrit to below 52 and then   try to get down to 47 or 48. I would go lower if there is any evidence of primary disease.  ____________________________ Simonne Come. Inez Pilgrim, MD rgg:cbb D: 04/25/2012 15:42:05 ET T: 04/25/2012 16:25:35 ET JOB#: [id number]  cc: Simonne Come. Inez Pilgrim, MD, <Dictator> Dallas Schimke MD ELECTRONICALLY SIGNED 06/26/2012 21:12

## 2014-09-03 NOTE — Consult Note (Signed)
Brief Consult Note: Diagnosis: COPD, Polycythemia.   Patient was seen by consultant.   Consult note dictated.   Orders entered.   Comments: will continue to follow for COPD,  thanks.  Electronic Signatures: Vaughan Basta (MD)  (Signed 17-Jul-15 19:22)  Authored: Brief Consult Note   Last Updated: 17-Jul-15 19:22 by Vaughan Basta (MD)

## 2014-09-03 NOTE — H&P (Signed)
PATIENT NAME:  Erica Poole, Erica Poole MR#:  [id number] DATE OF BIRTH:  10-11-1945  DATE OF ADMISSION:  11/26/2013  HISTORY OF PRESENT ILLNESS: Erica Poole is a 69 year old patient who presented to the clinic, she is known to me from previous evaluations with secondary polycythemia, secondary hypoxia, COPD and heart failure, and was last seen in April 2014. 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 See also additional history below. The patient was followed by primary care and in the interval was lost to follow-up, but is referred back for increase in hemoglobin and hematocrit. Erica Poole has polycythemia, but a number of symptoms and she is going to be hospitalized. Specifically, she has had 1 or more weeks of numbness in the hands, feet, fingers, and face, she has had vertigo over the similar timeframe, had some chills yesterday but no fever, has been cold, was started on antibiotics 48 hours ago with Augmentin for an ear infection, also given meclizine, her hematocrit currently is 59, and she is hypoxic in the clinic, 89 to 91% sitting and on ambulation O2 dropped to 84 to 88%. She was placed on oxygen in the clinic. Therefore, for the combination of hypotension and polycythemia requires fluids and then phlebotomy, hypoxia attributed to COPD, and what looks like symptomatic polycythemia, she needs hospitalization with initial treatment plan of fluids and phlebotomy repeated down to a more appropriate hematocrit. The patient does not currently have chest pain or retrosternal pain or palpitations. She has no headache now. She had some slight headache. No visual disturbances. No productive cough. She has not had wheezing. She has used an inhaler at home for shortness of breath. She has waxing and waning extremity edema that has not been increased recently. No abdominal pain. No nausea  or vomiting. No diarrhea. No focal weakness. No rash. No bruising. No pruritus. No back pain or bone pain.   PAST MEDICAL HISTORY: Includes hypertension, fibrocystic breast disease, diastolic congestive heart failure, COPD, secondary polycythemia, lower extremity DVT that was precipitated by long immobility and at one point the patient sat 16 to 18 hours one time all night playing video games, barely getting up, and chronic lower extremity edema. The patient had been placed on Xarelto and was recommended by her pulmonologist and cardiologist to continue it. She had also had an IVC filter and never had that removed so she also has IVC filter that would require long-term anticoagulation. Initial work-up included (Dictation Anomaly) <<MISSING TEXT>> and CA-19-9 and CA-125. Full otherwise hypercoagulable studies were not done at the time of her initial clot.   FAMILY HISTORY: Includes a sister who had lung cancer. Sister was a smoker.   SOCIAL HISTORY: No alcohol. She was an on and off smoker, 1-1/2 packs a day, quit for a while and went back to smoking, quit, went back to smoking, has quit in the last week.   ALLERGIES: No known drug allergies.   CURRENT MEDICATIONS: Xarelto 20 mg daily, atorvastatin 20 mg at night, ProAir inhaler 2 puffs 4 times a day, Symbicort 1 inhalation twice a day, atenolol 100 mg, chlorthalidone 25 mg once a day, Augmentin a 10 day course that was just given 2 days ago, and meclizine 12.5 mg daily that was started 2 days ago.   PHYSICAL EXAMINATION: VITAL SIGNS: Temperature 97.9, blood pressure 96/66, pulse 56, respirations 18, and room air saturation at rest was 89 to 91 and  it had been 84 to 88 on ambulation. This corrected with nasal oxygen.  HEENT: Sclerae: No jaundice. Mouth: No thrush.  NECK: No mass.  LYMPHATIC: No palpable lymphadenopathy in the neck, supraclavicular, submandibular, or axilla.  LUNGS: Decreased air entry. No wheezing.  HEART: Regular with some  bradycardia.  ABDOMEN: Nontender. No palpable mass or organomegaly.  EXTREMITIES: Symmetric 1+ edema below the knees and no significant bruising.  NEUROLOGIC: No gross focal weakness. Cranial nerves intact.   DIAGNOSTIC DATA: Platelets and white count are unremarkable. Hematocrit was 59.   Additionally, with respect to screening studies, the patient had a colonoscopy in June of 2014. She is overdue for mammogram, which was previously suggested to be repeated on the right for an abnormal but benign appearing area.   IMPRESSION: The patient with underlying medical problems as above and includes history of hypertension, has chronic obstructive pulmonary disease, has had and has again apparently secondary polycythemia, currently appears to have symptomatic polycythemia. There was numbness and tingling of the lips, face and fingers. The patient always has some numbness in the feet, likely chronic peripheral neuropathy, but these other symptoms are relatively new. There is no visual disturbance or chest pain or any focal weakness or visual loss. Issues as above, overdue for mammogram.   PLAN: The patient is hospice, initially treated in the clinic with intravenous fluids. Low volume phlebotomy as blood pressure allows and then perform more phlebotomy. The goal is getting the hematocrit down today and then initially getting it down to 52 and then targeting 48. Smoking cessation. Oxygen. Follow-up a chest x-ray. Watch fever, temp curve, and with the patient already on antibiotics in the absence of any history of definite fever or sweats there is no role for blood cultures at this time. Continue usual medications, but would probably hold and then later probably or possibly reduce the dose, depending on the blood pressure, of her antihypertensives. SVN p.r.n. Steroids p.r.n. In the absence of wheezing will not automatically add steroids. Later  discharge plan of bilateral mammogram. 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 Later, [place] follow-up to maintain hematocrit with outpatient phlebotomies.  ____________________________ Simonne Come Inez Pilgrim, MD rgg:sb D: 11/26/2013 11:26:05 ET T: 11/26/2013 11:59:20 ET JOB#: [id number]  cc: Simonne Come. Inez Pilgrim, MD, <Dictator>

## 2014-09-03 NOTE — Consult Note (Signed)
PATIENT NAME:  Erica Poole, Erica Poole MR#:  [id number] DATE OF BIRTH:  Jul 25, 1945  DATE OF CONSULTATION:  11/26/2013  REFERRING PHYSICIAN:  Dr. Barbette Reichmann. CONSULTING PHYSICIAN:  Jennfier Abdulla G. Anselm Jungling, MD  REASON FOR MEDICAL CONSULT: Shortness of breath.   HISTORY OF PRESENT ILLNESS: A 69 year old female who has past medical history of COPD, DVT, and polycythemia who is admitted after having phlebotomy done at cancer center because of some shortness of breath to hospital by Dr. Inez Pilgrim.  She is feeling somewhat short of breath and Dr. Inez Pilgrim is planning to do phlebotomy tomorrow morning again to help her lose some more blood.   On my questioning, the patient denies any cough or sputum production. Denies any fever she said as she is a chronic smoker. She just of smoking 2 weeks ago, and at home, she has feeling of shortness of breath while climbing up stairs, but does not require any supplemental oxygen. Currently in the hospital requiring supplemental oxygen to maintain her oxygen saturation more than 90%.  REVIEW OF SYSTEMS:  CONSTITUTIONAL: Negative for fever, fatigue, weakness, pain, or weight loss.  EYES: No blurring  or double vision, discharge, or redness.  EARS, NOSE, THROAT: No tinnitus, ear pain, or hearing loss.  RESPIRATORY: No cough, wheezing, hemoptysis, or shortness of breath.  CARDIOVASCULAR: No chest pain, orthopnea, edema, arrhythmia, palpitation.  GASTROINTESTINAL: No nausea, vomiting, diarrhea, abdominal pain.  GENITOURINARY: No dysuria, hematuria, or increased frequency.  ENDOCRINE: No heat or cold intolerance. No excessive sweating.  SKIN: No acne, rashes, or lesions.  MUSCULOSKELETAL: No pain or swelling in the joints.  PSYCHIATRIC: No anxiety, insomnia, bipolar disorder.   PAST MEDICAL HISTORY:  1.  Hypertension.  2.  Fibrocystic breast disease.  3.  Diastolic congestive heart failure.  4.  Chronic obstructive pulmonary disease.  5.  Secondary polycythemia.  6.   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   SOCIAL HISTORY: She is chronic smoker who quit smoking 2 weeks ago. No alcohol. No illegal drug use. Lives with family and does not require oxygen, so lives with her boyfriend.   HOME MEDICATIONS:  1.  Symbicort 1 puff inhalation 2 times a day.  2.  Xarelto 20 mg oral once a day.  3.  ProAir 2 puff inhalation 4 times a day.  4.  Meclizine 12.5 mg oral 2 times a day.  5.  Amoxicillin every 12 hours.   PHYSICAL EXAMINATION:  VITAL SIGNS: Currently, temperature 97.2, pulse is 51, which dropped down to 46, respiration is 20, blood pressure is 110/64, and pulse oximetry is 92 on 2 liters  GENERAL: The patient is fully alert and oriented to time, place, and person. Does not appear in acute distress HEENT:  Head is atraumatic.  Conjunctivae pink.  Oral mucosa moist.  NECK: Supple. No JVD.  RESPIRATORY: Bilateral equal air entry, some wheezing present.  CARDIOVASCULAR: S1, S2 present, regular. No murmur.  ABDOMEN: Soft, nontender. Bowel sounds present. No organomegaly.   SKIN:  No rashes.  LEGS: No edema.  NEUROLOGICAL: Power 5/5. No tremor or rigidity. No sensory loss.  PSYCHIATRIC: Does not appear in any acute psychiatric illness. JOINTS: No swelling or tenderness.  IMPORTANT LABORATORY RESULTS: Chest x-ray: COPD. No acute cardiopulmonary disease. Stable appearance from prior study.  Creatinine 1.1, WBC 9.9, hemoglobin 18.9, and platelet count is 214,000.  Her hematocrit count was 59.7 early in the morning, which came down to  55.8 after getting phlebotomy in the office.  Mean corpuscular volume 91.   ASSESSMENT AND PLAN: A 69 year old female with polycythemia, chronic obstructive pulmonary disease, diastolic congestive heart failure, and hypertension admitted by Dr. Inez Pilgrim after a phlebotomy and a medical consult for shortness of breath.   1.  Shortness of breath. The patient  has baseline chronic obstructive pulmonary disease. Currently, not in much exacerbation, but there is some mild wheezing present.  So I will put patient on nebulizer therapy and will continue her Advair, but add Spiriva, and will give oral prednisone. I do not think she required IV steroid or IV antibiotic at this time. We will continue monitoring.   2.  Secondary polycythemia.  Management as per Dr. Inez Pilgrim.   3.  Hyperlipidemia. Continue atorvastatin.   4.  History of deep vein thrombosis, continue Xarelto.   5.  Bradycardia. As per Dr. Inez Pilgrim, patient was taking a beta blocker and he stopped in the office today, but she took today morning dose. So we will continue monitoring her over here. She is asymptomatic currently.  6.  CODE STATUS: FULL CODE.  TOTAL TIME SPENT ON THIS CONSULT: 40 minutes.   Will continue following for other medical issues.    ____________________________ Ceasar Lund Anselm Jungling, MD vgv:ts D: 11/26/2013 19:31:39 ET T: 11/26/2013 20:27:47 ET JOB#: [id number]  cc: Ceasar Lund. Anselm Jungling, MD, <Dictator> Vaughan Basta MD ELECTRONICALLY SIGNED 12/07/2013 16:14

## 2014-09-03 NOTE — H&P (Signed)
PATIENT NAME:  Erica Poole, Erica Poole MR#:  [id number] DATE OF BIRTH:  16-Oct-1945  DATE OF ADMISSION:  11/26/2013  HISTORY OF PRESENT ILLNESS: Erica Poole is a 69 year old patient who presented to the clinic, she is known to me from previous evaluations with secondary polycythemia, secondary hypoxia, COPD and heart failure, and was last seen in April 2014. 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 See also additional history below. The patient was followed by primary care and in the interval was lost to follow-up, but is referred back for increase in hemoglobin and hematocrit. Erica Poole has polycythemia, but a number of symptoms and she is going to be hospitalized. Specifically, she has had 1 or more weeks of numbness in the hands, feet, fingers, and face, she has had vertigo over the similar timeframe, had some chills yesterday but no fever, has been cold, was started on antibiotics 48 hours ago with Augmentin for an ear infection, also given meclizine, her hematocrit currently is 59, and she is hypoxic in the clinic, 89 to 91% sitting and on ambulation O2 dropped to 84 to 88%. She was placed on oxygen in the clinic. Therefore, for the combination of hypotension and polycythemia requires fluids and then phlebotomy, hypoxia attributed to COPD, and what looks like symptomatic polycythemia, she needs hospitalization with initial treatment plan of fluids and phlebotomy repeated down to a more appropriate hematocrit. The patient does not currently have chest pain or retrosternal pain or palpitations. She has no headache now. She had some slight headache. No visual disturbances. No productive cough. She has not had wheezing. She has used an inhaler at home for shortness of breath. She has waxing and waning extremity edema that has not been increased recently. No abdominal pain. No nausea  or vomiting. No diarrhea. No focal weakness. No rash. No bruising. No pruritus. No back pain or bone pain.   PAST MEDICAL HISTORY: Includes hypertension, fibrocystic breast disease, diastolic congestive heart failure, COPD, secondary polycythemia, lower extremity DVT that was precipitated by long immobility and at one point the patient sat 16 to 18 hours one time all night playing video games, barely getting up, and chronic lower extremity edema. The patient had been placed on Xarelto and was recommended by her pulmonologist and cardiologist to continue it. She had also had an IVC filter and never had that removed so she also has IVC filter that would require long-term anticoagulation. Initial work-up included  CA-19-9 and CA-125. Full otherwise hypercoagulable studies were not done at the time of her initial clot.   FAMILY HISTORY: Includes a sister who had lung cancer. Sister was a smoker.   SOCIAL HISTORY: No alcohol. She was an on and off smoker, 1-1/2 packs a day, quit for a while and went back to smoking, quit, went back to smoking, has quit in the last week.   ALLERGIES: No known drug allergies.   CURRENT MEDICATIONS: Xarelto 20 mg daily, atorvastatin 20 mg at night, ProAir inhaler 2 puffs 4 times a day, Symbicort 1 inhalation twice a day, atenolol 100 mg, chlorthalidone 25 mg once a day, Augmentin a 10 day course that was just given 2 days ago, and meclizine 12.5 mg daily that was started 2 days ago.   PHYSICAL EXAMINATION: VITAL SIGNS: Temperature 97.9, blood pressure 96/66, pulse 56, respirations 18, and room air saturation at rest was 89 to 91 and it had been 84  to 88 on ambulation. This corrected with nasal oxygen.  HEENT: Sclerae: No jaundice. Mouth: No thrush.  NECK: No mass.  LYMPHATIC: No palpable lymphadenopathy in the neck, supraclavicular, submandibular, or axilla.  LUNGS: Decreased air entry. No wheezing.  HEART: Regular with some bradycardia.  ABDOMEN: Nontender. No palpable  mass or organomegaly.  EXTREMITIES: Symmetric 1+ edema below the knees and no significant bruising.  NEUROLOGIC: No gross focal weakness. Cranial nerves intact.   DIAGNOSTIC DATA: Platelets and white count are unremarkable. Hematocrit was 59.   Additionally, with respect to screening studies, the patient had a colonoscopy in June of 2014. She is overdue for mammogram, which was previously suggested to be repeated on the right for an abnormal but benign appearing area.   IMPRESSION: The patient with underlying medical problems as above and includes history of hypertension, has chronic obstructive pulmonary disease, has had and has again apparently secondary polycythemia, currently appears to have symptomatic polycythemia. There was numbness and tingling of the lips, face and fingers. The patient always has some numbness in the feet, likely chronic peripheral neuropathy, but these other symptoms are relatively new. There is no visual disturbance or chest pain or any focal weakness or visual loss. Issues as above, overdue for mammogram.   PLAN: The patient is hospice, initially treated in the clinic with intravenous fluids. Low volume phlebotomy as blood pressure allows and then perform more phlebotomy. The goal is getting the hematocrit down today and then initially getting it down to 52 and then targeting 48. Smoking cessation. Oxygen. Follow-up a chest x-ray. Watch fever, temp curve, and with the patient already on antibiotics in the absence of any history of definite fever or sweats there is no role for blood cultures at this time. Continue usual medications, but would probably hold and then later probably or possibly reduce the dose, depending on the blood pressure, of her antihypertensives. SVN p.r.n. Steroids p.r.n. In the absence of wheezing will not automatically add steroids. Later  discharge plan of bilateral mammogram. Will also document the a JAK 12 mutation. Later, [place] follow-up to  maintain hematocrit with outpatient phlebotomies.  ____________________________ Simonne Come Inez Pilgrim, MD rgg:sb D: 11/26/2013 11:26:00 ET T: 11/26/2013 11:59:20 ET JOB#: [id number]  cc: Simonne Come. Inez Pilgrim, MD, <Dictator> Dallas Schimke MD ELECTRONICALLY SIGNED 12/21/2013 23:52

## 2014-09-08 ENCOUNTER — Other Ambulatory Visit: Payer: Self-pay | Admitting: *Deleted

## 2014-09-08 DIAGNOSIS — D751 Secondary polycythemia: Secondary | ICD-10-CM

## 2014-09-13 ENCOUNTER — Other Ambulatory Visit: Payer: PRIVATE HEALTH INSURANCE

## 2014-09-14 ENCOUNTER — Other Ambulatory Visit: Payer: Self-pay | Admitting: *Deleted

## 2014-09-15 ENCOUNTER — Inpatient Hospital Stay: Payer: Medicare Other

## 2014-09-15 ENCOUNTER — Inpatient Hospital Stay: Payer: Medicare Other | Attending: Internal Medicine

## 2014-09-15 VITALS — BP 159/60 | HR 57 | Temp 97.0°F | Resp 20

## 2014-09-15 DIAGNOSIS — D751 Secondary polycythemia: Secondary | ICD-10-CM

## 2014-09-15 DIAGNOSIS — D45 Polycythemia vera: Secondary | ICD-10-CM | POA: Insufficient documentation

## 2014-09-15 LAB — CBC WITH DIFFERENTIAL/PLATELET
BASOS ABS: 0.1 10*3/uL (ref 0–0.1)
Basophils Relative: 1 %
Eosinophils Absolute: 0.2 10*3/uL (ref 0–0.7)
Eosinophils Relative: 3 %
HEMATOCRIT: 49.4 % — AB (ref 35.0–47.0)
HEMOGLOBIN: 15.5 g/dL (ref 12.0–16.0)
Lymphocytes Relative: 25 %
Lymphs Abs: 2 10*3/uL (ref 1.0–3.6)
MCH: 27.2 pg (ref 26.0–34.0)
MCHC: 31.4 g/dL — AB (ref 32.0–36.0)
MCV: 86.9 fL (ref 80.0–100.0)
MONO ABS: 0.6 10*3/uL (ref 0.2–0.9)
Monocytes Relative: 7 %
Neutro Abs: 5.1 10*3/uL (ref 1.4–6.5)
Neutrophils Relative %: 64 %
Platelets: 227 10*3/uL (ref 150–440)
RBC: 5.68 MIL/uL — AB (ref 3.80–5.20)
RDW: 18.4 % — AB (ref 11.5–14.5)
WBC: 8 10*3/uL (ref 3.6–11.0)

## 2014-09-19 ENCOUNTER — Telehealth: Payer: Self-pay | Admitting: Internal Medicine

## 2014-09-27 ENCOUNTER — Other Ambulatory Visit: Payer: PRIVATE HEALTH INSURANCE

## 2014-09-30 NOTE — Telephone Encounter (Signed)
erroneous

## 2014-10-07 ENCOUNTER — Other Ambulatory Visit: Payer: Self-pay | Admitting: *Deleted

## 2014-10-07 DIAGNOSIS — D751 Secondary polycythemia: Secondary | ICD-10-CM

## 2014-10-11 ENCOUNTER — Other Ambulatory Visit: Payer: PRIVATE HEALTH INSURANCE

## 2014-10-11 ENCOUNTER — Ambulatory Visit: Payer: PRIVATE HEALTH INSURANCE | Admitting: Internal Medicine

## 2014-11-18 IMAGING — US US EXTREM LOW VENOUS BILAT
1 series · 14 of 24 positions shown · non-contrast
Comparison: none

REASON FOR EXAM: new swelling of lower extremities
COMMENTS:

[Series 1: us extrem low venous bilat · 0.11mm/px · 14 of 49 slices shown]
[im 1/49]
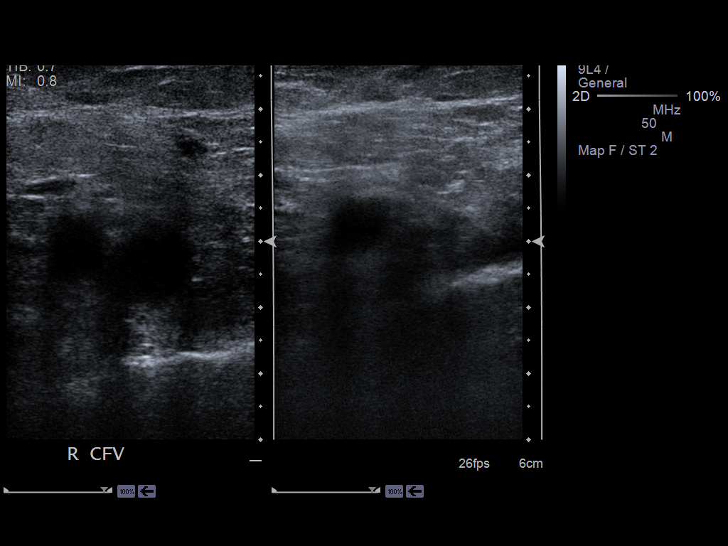
[im 5/49]
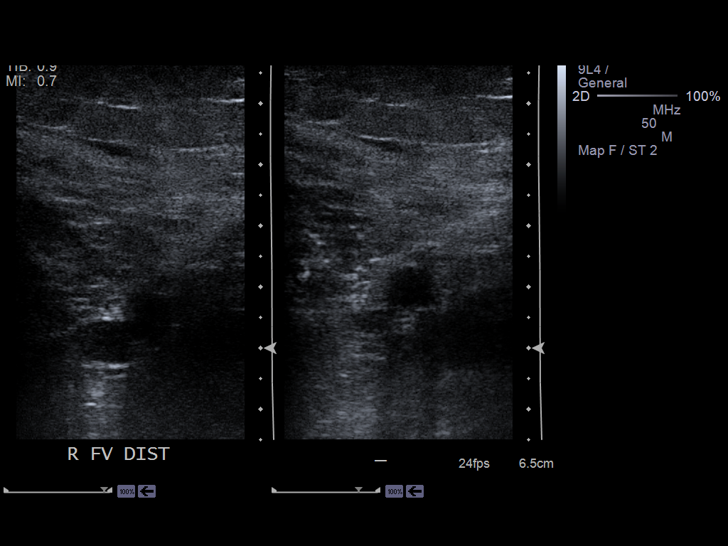
[im 9/49]
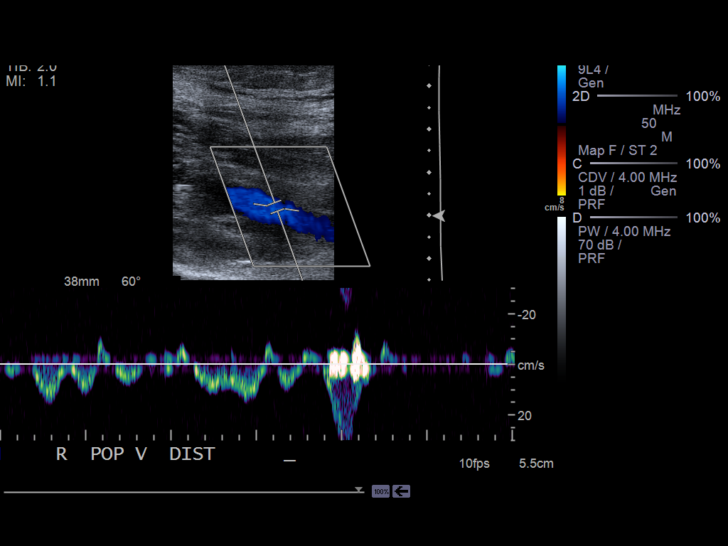
[im 13/49]
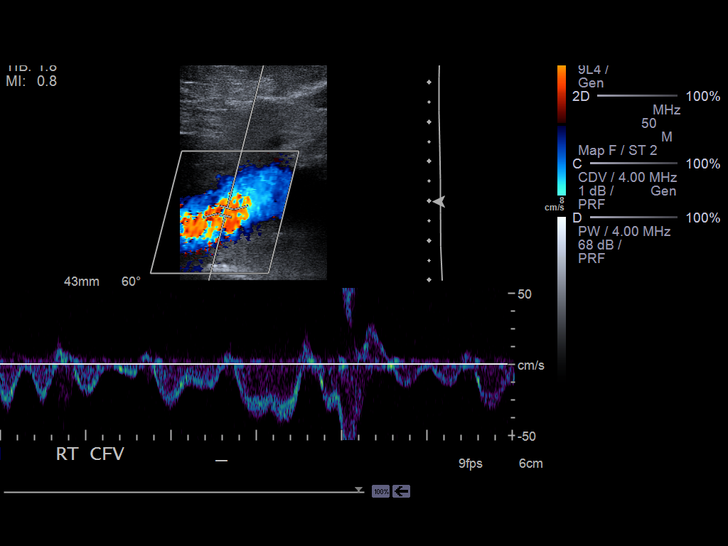
[im 15/49]
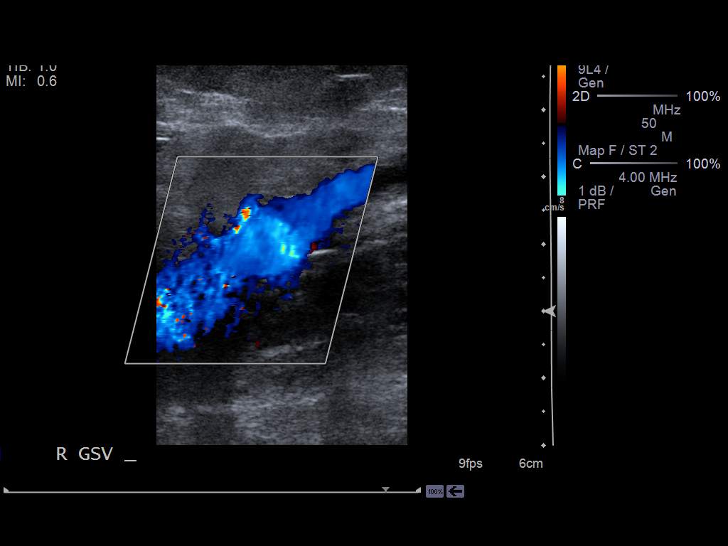
[im 19/49]
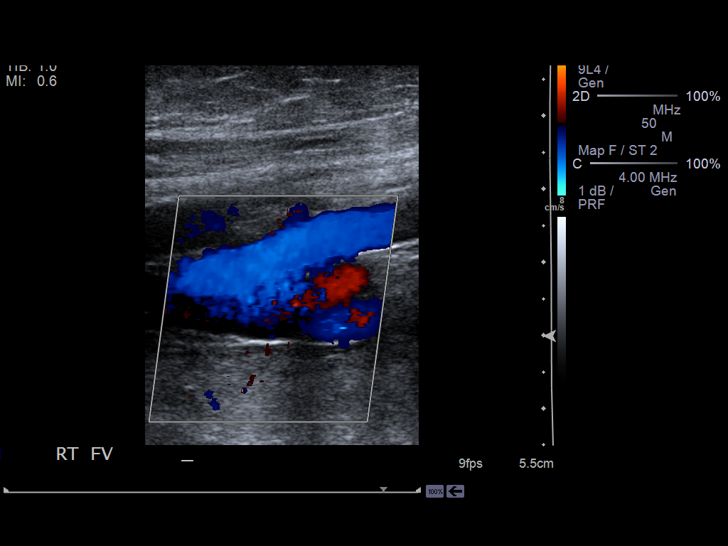
[im 23/49]
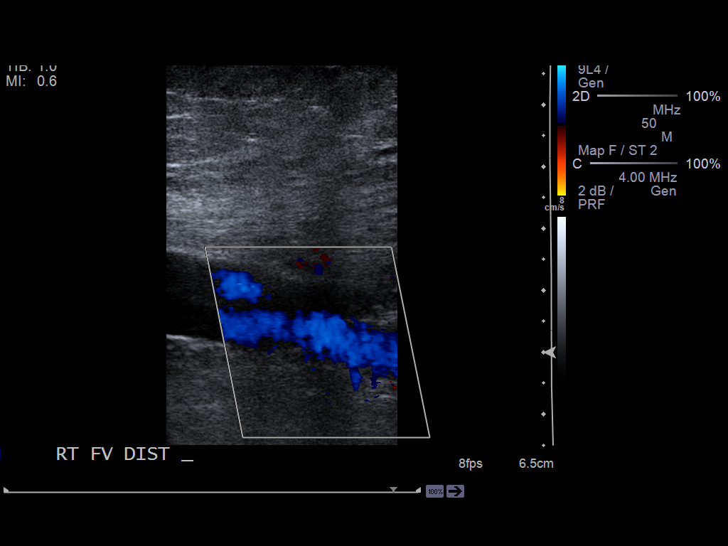
[im 26/49]
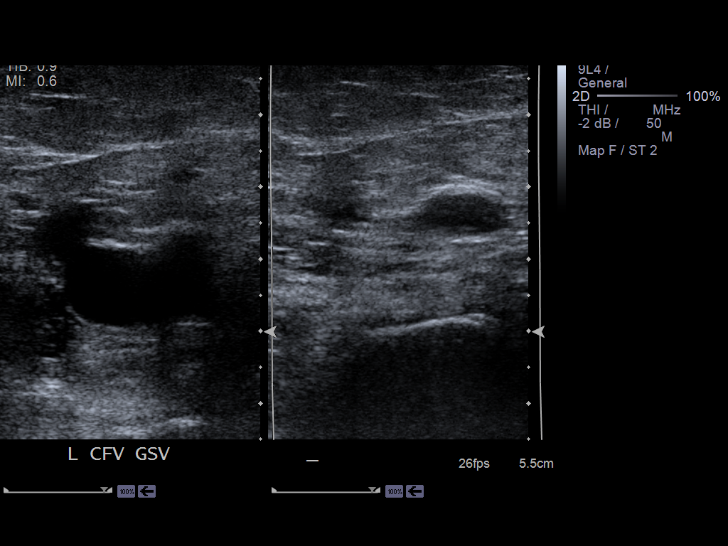
[im 30/49]
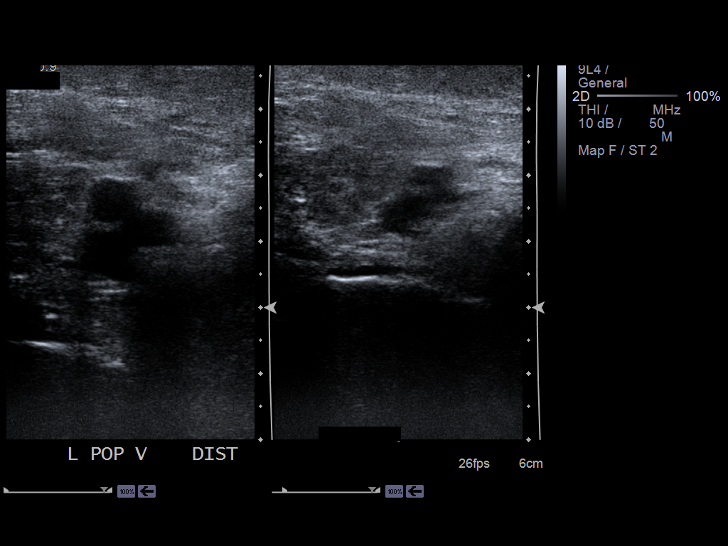
[im 34/49]
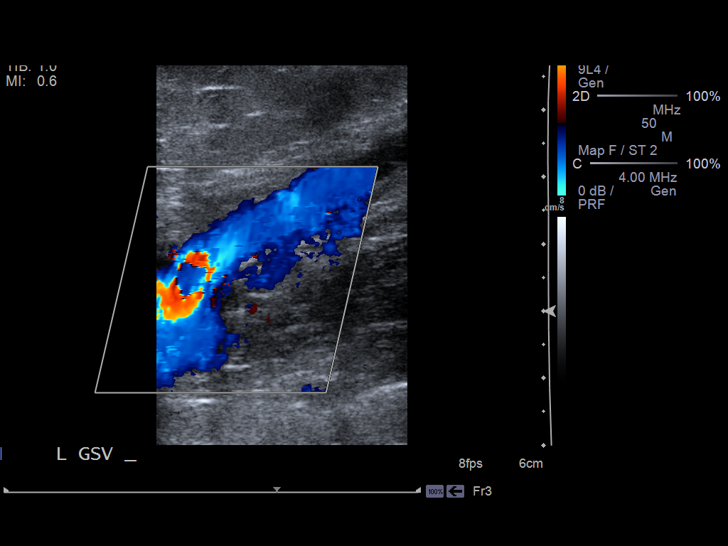
[im 38/49]
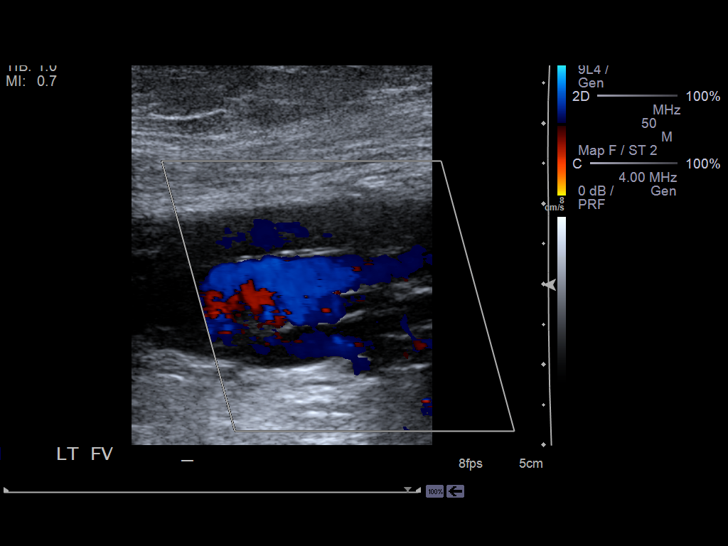
[im 40/49]
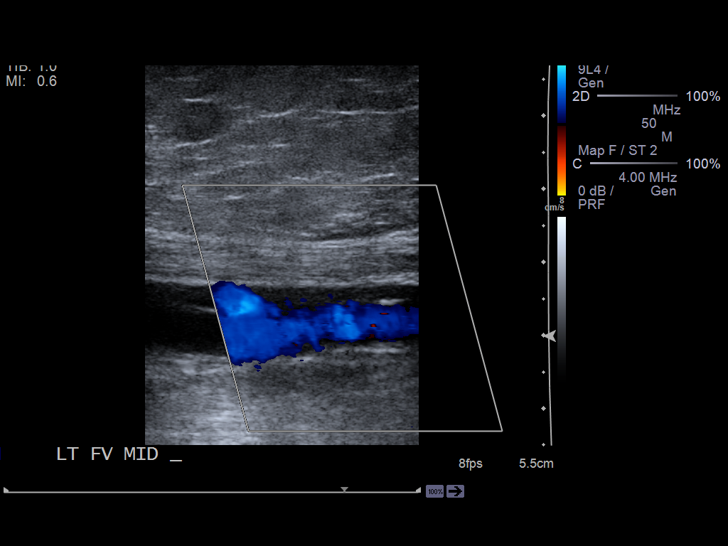
[im 44/49]
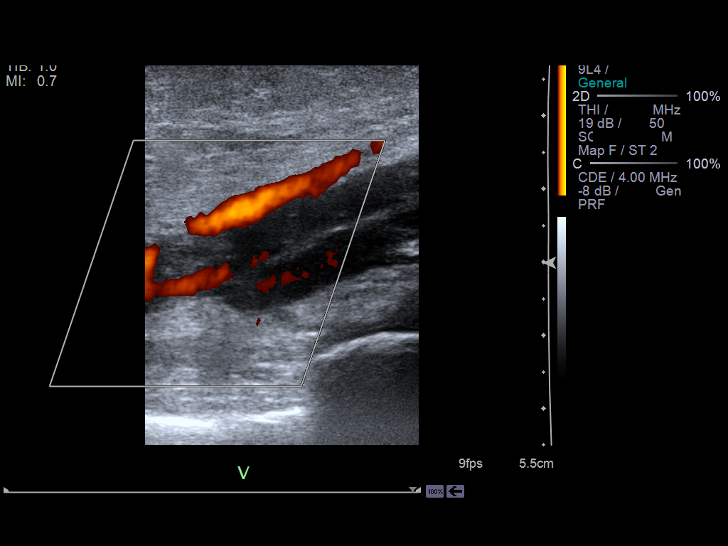
[im 49/49]
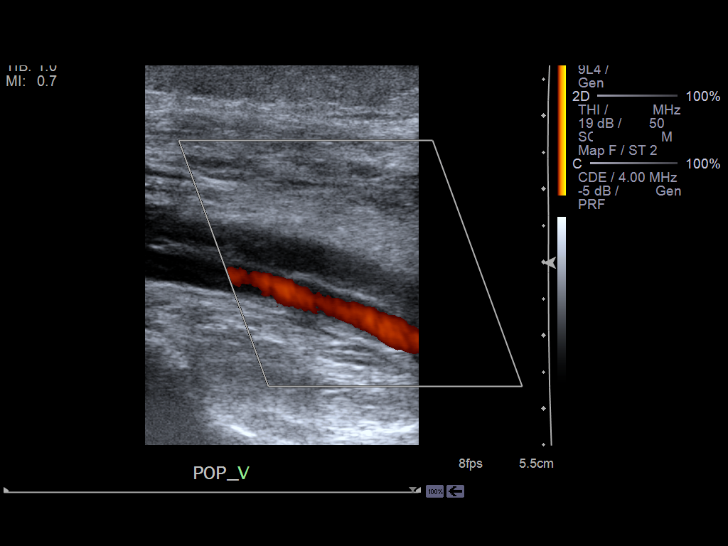

[14 of 24 positions shown; findings below may reference images not displayed]

PROCEDURE:     US  - US DOPPLER LOW EXTR BILATERAL  - April 24, 2012  [DATE]

RESULT:     Doppler interrogation of the deep venous system is performed in
the standard fashion. The patient has no previous exam for comparison. The
study is performed in both lower extremities. The right lower extremity from
the common femoral vein through the popliteal vein shows normal
compressibility with normal color Doppler and spectral Doppler appearance.
There is no abnormal fluid collection.

The left lower extremity shows partial compressibility in the distal
superficial femoral vein and in the popliteal vein with filling defect on
the color Doppler images. Findings are consistent with nonocclusive thrombus
in the distal superficial femoral vein and popliteal vein of the deep venous
system.
IMPRESSION: 1. Nonocclusive thrombus in the deep venous system of the left leg in the
popliteal vein and distal femoral vein.

Dictations colon 6(*)

## 2014-11-22 IMAGING — CR DG CHEST 1V PORT
1 series · 1 of 1 positions shown · non-contrast
Comparison: none

REASON FOR EXAM: cough and sob
COMMENTS:

[ap]
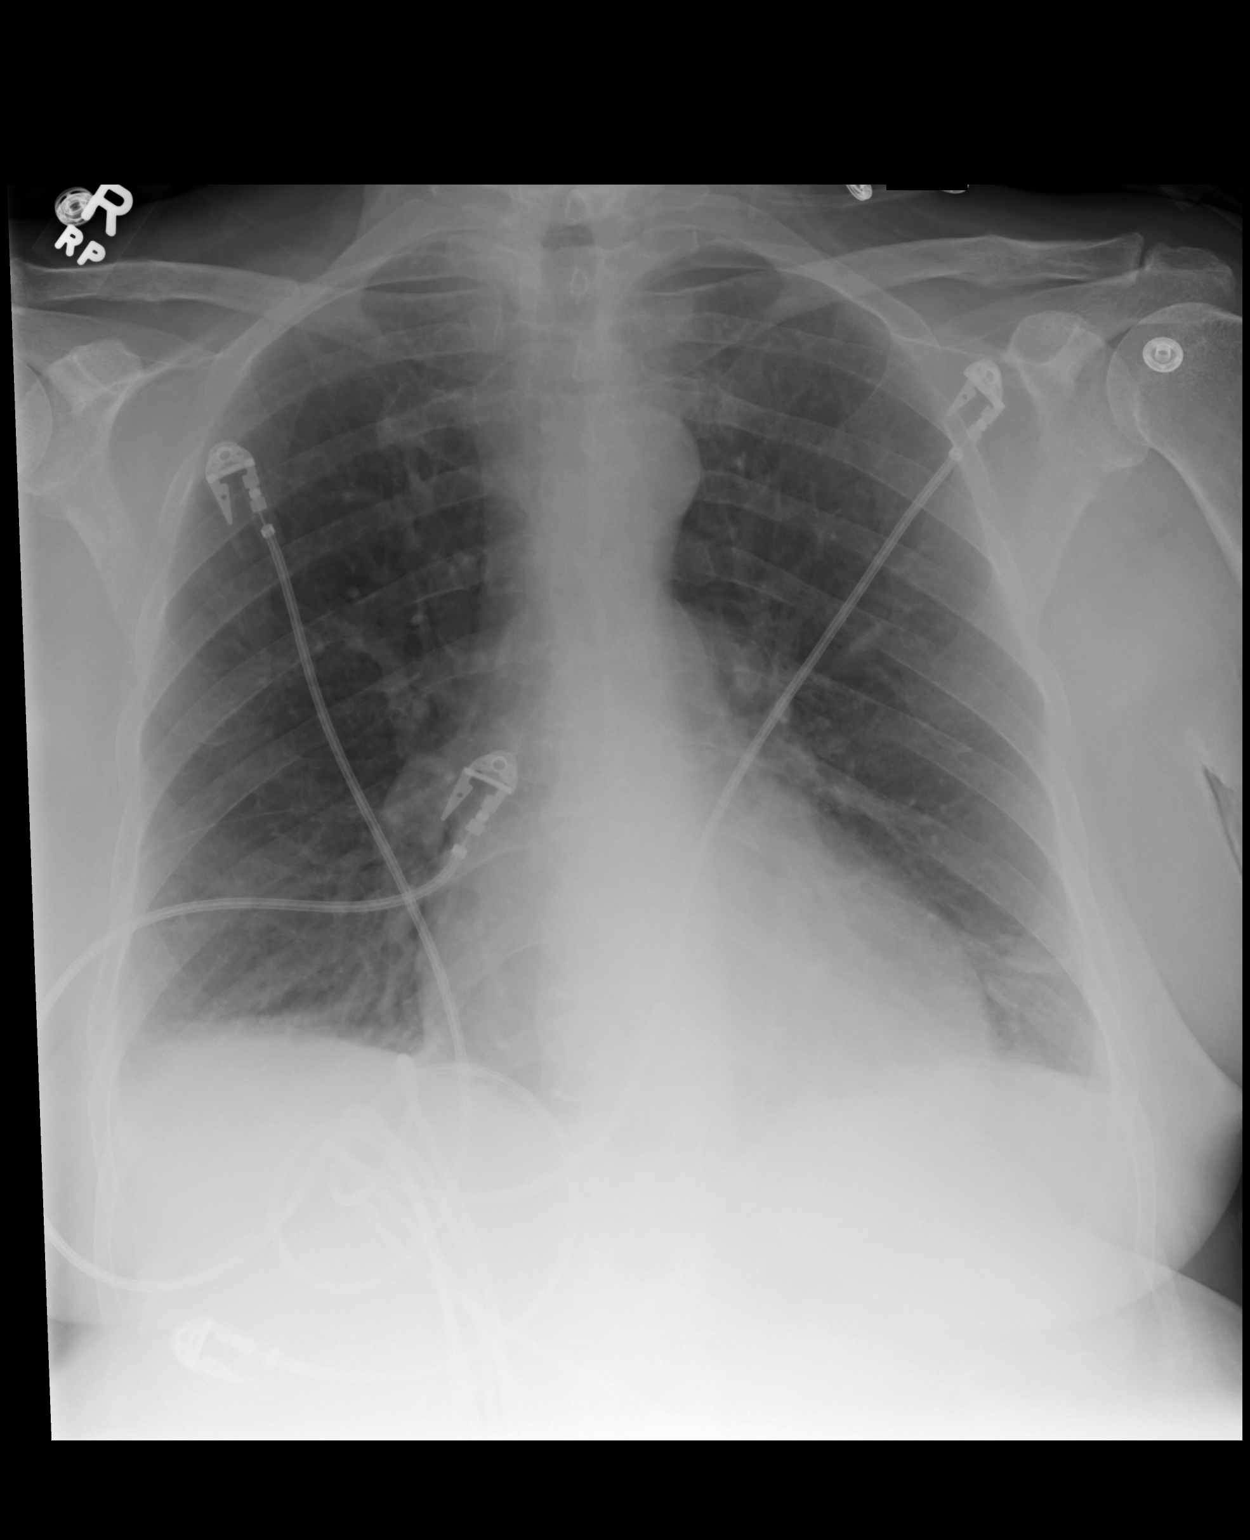

[1 of 1 positions shown; findings below may reference images not displayed]

PROCEDURE:     DXR - DXR PORTABLE CHEST SINGLE VIEW  - April 28, 2012 [DATE]

RESULT:     Comparison is made to the previous study 24 April, 2012.
Linear fibrosis versus discoid atelectasis is seen in the left lung
inferiorly. The lungs are hyperinflated. The cardiac silhouette is normal.
There is no edema, effusion or pneumothorax.
IMPRESSION: Hyperinflation consistent with COPD. Some linear fibrosis
or discord atelectasis near the left lung base laterally.

[REDACTED]

## 2014-11-29 ENCOUNTER — Ambulatory Visit: Payer: Medicare Other | Admitting: Family Medicine

## 2014-11-29 ENCOUNTER — Other Ambulatory Visit: Payer: Medicare Other

## 2014-12-27 ENCOUNTER — Other Ambulatory Visit: Payer: Self-pay | Admitting: *Deleted

## 2014-12-27 DIAGNOSIS — D45 Polycythemia vera: Secondary | ICD-10-CM

## 2015-01-03 ENCOUNTER — Inpatient Hospital Stay: Payer: Medicare Other | Attending: Family Medicine | Admitting: Family Medicine

## 2015-01-03 ENCOUNTER — Inpatient Hospital Stay: Payer: Medicare Other

## 2015-01-06 ENCOUNTER — Telehealth: Payer: Self-pay

## 2015-01-06 NOTE — Telephone Encounter (Signed)
Navigator Encounter Type: Telephone, Screening  Notified patient that annual lung cancer screening low dose CT scan is due. Confirmed that patient is within the age range 50-77 and asymptomatic. The patient is a smoker with a 61 pkyr history. Shared Decision Making Visit was done on 12/31/2013. Patient is agreeable to scheduling of CT scan.

## 2015-01-17 ENCOUNTER — Inpatient Hospital Stay: Payer: Medicare Other

## 2015-01-17 ENCOUNTER — Inpatient Hospital Stay: Payer: Medicare Other | Admitting: Family Medicine

## 2015-01-20 ENCOUNTER — Ambulatory Visit
Admission: EM | Admit: 2015-01-20 | Discharge: 2015-01-20 | Disposition: A | Discharge status: Home / Self Care | Payer: Medicare Other | Attending: Family Medicine | Admitting: Family Medicine

## 2015-01-20 ENCOUNTER — Encounter: Payer: Self-pay | Admitting: Emergency Medicine

## 2015-01-20 DIAGNOSIS — L03119 Cellulitis of unspecified part of limb: Secondary | ICD-10-CM | POA: Diagnosis not present

## 2015-01-20 DIAGNOSIS — R252 Cramp and spasm: Secondary | ICD-10-CM | POA: Diagnosis not present

## 2015-01-20 DIAGNOSIS — L02419 Cutaneous abscess of limb, unspecified: Secondary | ICD-10-CM | POA: Diagnosis not present

## 2015-01-20 MED ORDER — MUPIROCIN 2 % EX OINT: 1.0000 "application " | TOPICAL_OINTMENT | Freq: Three times a day (TID) | CUTANEOUS | Status: DC

## 2015-01-20 MED ORDER — MAGNESIUM OXIDE 400 MG PO TABS: 400.0000 mg | ORAL_TABLET | Freq: Every day | ORAL | Status: AC

## 2015-01-20 MED ORDER — SULFAMETHOXAZOLE-TRIMETHOPRIM 800-160 MG PO TABS: 1.0000 | ORAL_TABLET | Freq: Two times a day (BID) | ORAL | Status: DC

## 2015-01-20 NOTE — ED Notes (Signed)
Patient c/o redness, swelling, and drainage from the cut on her right lower leg.  Patient sate the she hit her right lower leg on the stairs about a month ago.

## 2015-01-20 NOTE — ED Provider Notes (Signed)
CSN: [id number]     Arrival date & time 01/20/15  1101 History   First MD Initiated Contact with Patient 01/20/15 1333     Chief Complaint  Patient presents with  . Cellulitis   (Consider location/radiation/quality/duration/timing/severity/associated sxs/prior Treatment) Patient is a 69 y.o. female presenting with rash. The history is provided by the patient. No language interpreter was used.  Rash Location:  Leg Leg rash location:  R leg Quality: blistering, redness and swelling   Severity:  Moderate Duration:  2 weeks Progression:  Worsening Chronicity:  New Context: not chemical exposure, not exposure to similar rash, not medications, not new detergent/soap and not sun exposure   Relieved by:  Nothing Ineffective treatments:  Antibiotic cream Associated symptoms: joint pain and myalgias   Associated symptoms comment:  Cramping of the hands at times in the upper extremities   Past Medical History  Diagnosis Date  . Hypertension   . COPD (chronic obstructive pulmonary disease)   . Fibrocystic breast disease   . Tobacco abuse     a. quit 04/2012  . Bilateral pulmonary embolism     a. 04/24/2012 -->Started on Xarelto;  b. 04/28/2012 s/p IVC Filter.  . Left leg DVT     a. 04/24/2012 -->Started on Xarelto  . Polycythemia   . Chronic diastolic CHF (congestive heart failure)     a. 04/2012 Echo: EF 55%, mildly dil RV w/ mod reduced RV fxn, mild bi-atrial enlargement.  . Gout    Past Surgical History  Procedure Laterality Date  . Breast lumpectomy    . Cholecystectomy     Family History  Problem Relation Age of Onset  . Heart disease Mother   . Heart attack Mother   . Heart attack Sister   . Lung cancer Sister     sister was a smoker   Social History  Substance Use Topics  . Smoking status: Current Every Day Smoker -- 0.25 packs/day for 50 years    Types: Cigarettes    Last Attempt to Quit: 04/29/2012  . Smokeless tobacco: None  . Alcohol Use: No   OB History    No data available     Review of Systems  Musculoskeletal: Positive for myalgias and arthralgias.       Cramping of the hands is different from her arthritic pain has been chronic for over the last 2-3 years.  Skin: Positive for rash.  All other systems reviewed and are negative.  Patient was warned she needs to stop smoking. Charts reviewed her other medical problems. Allergies  Atorvastatin  Home Medications   Prior to Admission medications   Medication Sig Start Date End Date Taking? Authorizing Provider  allopurinol (ZYLOPRIM) 100 MG tablet Take 1 tablet by mouth 1 day or 1 dose.    Historical Provider, MD  budesonide-formoterol (SYMBICORT) 160-4.5 MCG/ACT inhaler Inhale 2 puffs into the lungs 2 (two) times daily.    Historical Provider, MD  Febuxostat (ULORIC PO) Take 100 mg by mouth 1 day or 1 dose.    Historical Provider, MD  lisinopril-hydrochlorothiazide (PRINZIDE,ZESTORETIC) 20-12.5 MG per tablet Take 2 tablets by mouth daily. 03/14/14   Minna Merritts, MD  magnesium oxide (MAG-OX) 400 MG tablet Take 1 tablet (400 mg total) by mouth daily. Recommend mag oxide brand-name his elbow over-the-counter yet 01/20/15   Frederich Cha, MD  meclizine (ANTIVERT) 12.5 MG tablet Take 1 tablet by mouth 2 (two) times daily.    Historical Provider, MD  [place] (BLACK CHERRY  CONCENTRATE) LIQD Take 15 mLs by mouth 1 day or 1 dose. For gout    Historical Provider, MD  mupirocin ointment (BACTROBAN) 2 % Apply 1 application topically 3 (three) times daily. 01/20/15   Frederich Cha, MD  rivaroxaban (XARELTO) 20 MG TABS tablet Take 20 mg by mouth daily with supper.    Historical Provider, MD  sulfamethoxazole-trimethoprim (BACTRIM DS,SEPTRA DS) 800-160 MG per tablet Take 1 tablet by mouth 2 (two) times daily. 01/20/15   Frederich Cha, MD  tiotropium (SPIRIVA) 18 MCG inhalation capsule Place 1 capsule into inhaler and inhale 1 day or 1 dose. 11/27/13   Historical Provider, MD   Meds Ordered and  Administered this Visit  Medications - No data to display  BP 135/94 mmHg  Pulse 78  Temp(Src) 97.2 F (36.2 C) (Tympanic)  Resp 16  Ht 5' (1.524 m)  Wt 170 lb (77.111 kg)  BMI 33.20 kg/m2  SpO2 98% No data found.   Physical Exam  Constitutional: She is oriented to person, place, and time. She appears well-developed and well-nourished.  HENT:  Head: Normocephalic.  Eyes: Conjunctivae are normal. Pupils are equal, round, and reactive to light.  Musculoskeletal: Normal range of motion. She exhibits no edema or tenderness.  No signs of cramping extremities course at this time.  Neurological: She is alert and oriented to person, place, and time. She has normal reflexes.  Skin: Skin is warm and intact. Lesion and rash noted. There is erythema.     Patient has a red rash cellulitis over the right lower leg is a central area of small ulceration that appears to be draining. There is redness around the area. Further around the area is some swelling. Pulses intact negative [place]  Psychiatric: She has a normal mood and affect. Her behavior is normal.  Vitals reviewed.   ED Course  Procedures (including critical care time)  Labs Review Labs Reviewed - No data to display  Imaging Review No results found.   Visual Acuity Review  Right Eye Distance:   Left Eye Distance:   Bilateral Distance:    Right Eye Near:   Left Eye Near:    Bilateral Near:         MDM   1. Cellulitis and abscess of lower extremity   2. Muscle cramping     Follow-up with PCP for possible neurological orthopedic referral for arthritis and numbness of the hands and extremities. For the cramping that she's having them that during the different from the other problems mentioned was been going on for now the last few weeks will recommend she try mag oxide 2 tablets a day until the cramping stops for a maximum week and the use of when necessary basis. For the cellulitis recommend she stop the Neosporin  use Bactrim and ointment Septra DS 1 tablet twice a day and follow-up if not better in a week     Frederich Cha, MD 01/20/15 1436

## 2015-01-20 NOTE — Discharge Instructions (Signed)
Cellulitis Cellulitis is an infection of the skin and the tissue under the skin. The infected area is usually red and tender. This happens most often in the arms and lower legs. HOME CARE   Take your antibiotic medicine as told. Finish the medicine even if you start to feel better.  Keep the infected arm or leg raised (elevated).  Put a warm cloth on the area up to 4 times per day.  Only take medicines as told by your doctor.  Keep all doctor visits as told. GET HELP IF:  You see red streaks on the skin coming from the infected area.  Your red area gets bigger or turns a dark color.  Your bone or joint under the infected area is painful after the skin heals.  Your infection comes back in the same area or different area.  You have a puffy (swollen) bump in the infected area.  You have new symptoms.  You have a fever. GET HELP RIGHT AWAY IF:   You feel very sleepy.  You throw up (vomit) or have watery poop (diarrhea).  You feel sick and have muscle aches and pains. MAKE SURE YOU:   Understand these instructions.  Will watch your condition.  Will get help right away if you are not doing well or get worse. Document Released: 10/16/2007 Document Revised: 09/13/2013 Document Reviewed: 07/15/2011 [facility] Patient Information 2015 [place], [place]. This information is not intended to replace advice given to you by your health care provider. Make sure you discuss any questions you have with your health care provider.  Muscle Cramps and Spasms Muscle cramps and spasms are when muscles tighten by themselves. They usually get better within minutes. Muscle cramps are painful. They are usually stronger and last longer than muscle spasms. Muscle spasms may or may not be painful. They can last a few seconds or much longer. HOME CARE  Drink enough fluid to keep your pee (urine) clear or pale yellow.  Massage, stretch, and relax the muscle.  Use a warm towel, heating pad, or warm  shower water on tight muscles.  Place ice on the muscle if it is tender or in pain.  Put ice in a plastic bag.  Place a towel between your skin and the bag.  Leave the ice on for 15-20 minutes, 03-04 times a day.  Only take medicine as told by your doctor. GET HELP RIGHT AWAY IF:  Your cramps or spasms get worse, happen more often, or do not get better with time. MAKE SURE YOU:  Understand these instructions.  Will watch your condition.  Will get help right away if you are not doing well or get worse. Document Released: 04/11/2008 Document Revised: 08/24/2012 Document Reviewed: 04/15/2012 [facility] Patient Information 2015 [place], [place]. This information is not intended to replace advice given to you by your health care provider. Make sure you discuss any questions you have with your health care provider.

## 2015-01-23 ENCOUNTER — Other Ambulatory Visit: Payer: Self-pay | Admitting: Family Medicine

## 2015-01-23 ENCOUNTER — Inpatient Hospital Stay: Payer: Medicare Other | Admitting: Family Medicine

## 2015-01-23 ENCOUNTER — Encounter: Payer: Self-pay | Admitting: Family Medicine

## 2015-01-23 ENCOUNTER — Inpatient Hospital Stay: Payer: Medicare Other | Attending: Family Medicine

## 2015-01-23 DIAGNOSIS — Z87891 Personal history of nicotine dependence: Secondary | ICD-10-CM | POA: Insufficient documentation

## 2015-01-23 HISTORY — DX: Personal history of nicotine dependence: Z87.891

## 2015-01-27 ENCOUNTER — Other Ambulatory Visit: Payer: Self-pay | Admitting: Internal Medicine

## 2015-01-27 ENCOUNTER — Encounter: Payer: Self-pay | Admitting: Internal Medicine

## 2015-01-27 ENCOUNTER — Ambulatory Visit: Admission: RE | Admit: 2015-01-27 | Payer: Medicare Other | Source: Ambulatory Visit

## 2015-01-27 DIAGNOSIS — E785 Hyperlipidemia, unspecified: Secondary | ICD-10-CM | POA: Insufficient documentation

## 2015-01-27 DIAGNOSIS — M109 Gout, unspecified: Secondary | ICD-10-CM | POA: Insufficient documentation

## 2015-01-30 ENCOUNTER — Ambulatory Visit: Payer: Self-pay | Admitting: Internal Medicine

## 2015-03-01 ENCOUNTER — Other Ambulatory Visit: Payer: Self-pay | Admitting: Internal Medicine

## 2015-03-02 ENCOUNTER — Other Ambulatory Visit: Payer: Self-pay | Admitting: Internal Medicine

## 2015-03-02 MED ORDER — MECLIZINE HCL 12.5 MG PO TABS: 12.5000 mg | ORAL_TABLET | Freq: Three times a day (TID) | ORAL | Status: DC | PRN

## 2015-03-09 ENCOUNTER — Ambulatory Visit: Payer: Self-pay | Admitting: Internal Medicine

## 2015-03-10 ENCOUNTER — Ambulatory Visit (INDEPENDENT_AMBULATORY_CARE_PROVIDER_SITE_OTHER): Payer: Medicare Other | Admitting: Internal Medicine

## 2015-03-10 ENCOUNTER — Encounter: Payer: Self-pay | Admitting: Internal Medicine

## 2015-03-10 VITALS — BP 110/70 | HR 84 | Ht 59.5 in | Wt 168.8 lb

## 2015-03-10 DIAGNOSIS — J439 Emphysema, unspecified: Secondary | ICD-10-CM

## 2015-03-10 DIAGNOSIS — Z23 Encounter for immunization: Secondary | ICD-10-CM | POA: Diagnosis not present

## 2015-03-10 DIAGNOSIS — I2699 Other pulmonary embolism without acute cor pulmonale: Secondary | ICD-10-CM

## 2015-03-10 DIAGNOSIS — I5032 Chronic diastolic (congestive) heart failure: Secondary | ICD-10-CM | POA: Diagnosis not present

## 2015-03-10 DIAGNOSIS — D751 Secondary polycythemia: Secondary | ICD-10-CM | POA: Diagnosis not present

## 2015-03-10 DIAGNOSIS — I10 Essential (primary) hypertension: Secondary | ICD-10-CM | POA: Diagnosis not present

## 2015-03-10 MED ORDER — ALBUTEROL SULFATE HFA 108 (90 BASE) MCG/ACT IN AERS: 2.0000 | INHALATION_SPRAY | Freq: Four times a day (QID) | RESPIRATORY_TRACT | Status: DC | PRN

## 2015-03-10 NOTE — Progress Notes (Signed)
Date:  03/10/2015   Name:  Erica Poole   DOB:  05/09/1946   MRN:  [id number]   Chief Complaint: COPD Shortness of Breath This is a chronic problem. The current episode started more than 1 year ago. The problem has been unchanged. Pertinent negatives include no abdominal pain, chest pain, ear pain, headaches, leg swelling, rash or wheezing. The symptoms are aggravated by any activity and pollens. She has tried steroid inhalers, beta agonist inhalers and ipratropium inhalers for the symptoms. Her past medical history is significant for COPD.   Pulmonary embolism - Patient has a history of DVT and pulmonary embolism. Hematology evaluation recommended lifelong anticoagulation. She is currently on Xarelto. She also has a diagnosis of polycythemia but has not seen the hematologist in some time.  Hypertension - patient is on lisinopril HCT for control of blood pressure. She denies any significant edema, chest pains or palpitations. She denies headaches or change in vision. Has diagnosis of chronic heart failure but states that she has not seen the cardiologist in one year. She is not aware that she should be following up regularly.  Polycythemia - patient was previously seen by hematology. She's not been there and some time to have a follow-up blood count. Review of the systems shows she had a future order scheduled for this past August for a CBC.  Review of Systems  Constitutional: Negative for chills, appetite change and fatigue.  HENT: Negative for ear pain, hearing loss, trouble swallowing and voice change.   Eyes: Negative for visual disturbance.  Respiratory: Positive for shortness of breath. Negative for cough, chest tightness and wheezing.   Cardiovascular: Negative for chest pain, palpitations and leg swelling.  Gastrointestinal: Negative for abdominal pain.  Skin: Negative for rash and wound.  Neurological: Negative for tremors, syncope, light-headedness and headaches.    Psychiatric/Behavioral: Negative for sleep disturbance and dysphoric mood.    Patient Active Problem List   Diagnosis Date Noted  . Controlled gout 01/27/2015  . Hyperlipidemia 01/27/2015  . Personal history of tobacco use, presenting hazards to health 01/23/2015  . Bilateral pulmonary embolism ([place]) 05/20/2012  . Left leg DVT ([place]) 05/20/2012  . Hypertension   . COPD (chronic obstructive pulmonary disease) ([place])   . Fibrocystic breast disease   . Tobacco abuse   . Polycythemia   . Chronic diastolic CHF (congestive heart failure) ([place])     Prior to Admission medications   Medication Sig Start Date End Date Taking? Authorizing Provider  allopurinol (ZYLOPRIM) 100 MG tablet Take 1 tablet by mouth 1 day or 1 dose.   Yes Historical Provider, MD  atorvastatin (LIPITOR) 20 MG tablet TAKE ONE TABLET BY MOUTH AT BEDTIME 03/03/15  Yes Glean Hess, MD  budesonide-formoterol [facility]) 160-4.5 MCG/ACT inhaler Inhale 2 puffs into the lungs 2 (two) times daily.   Yes Historical Provider, MD  Febuxostat (ULORIC PO) Take 100 mg by mouth 1 day or 1 dose.   Yes Historical Provider, MD  lisinopril-hydrochlorothiazide (PRINZIDE,ZESTORETIC) 10-12.5 MG per tablet Take 1 tablet by mouth daily. 01/20/15  Yes Historical Provider, MD  magnesium oxide (MAG-OX) 400 MG tablet Take 1 tablet (400 mg total) by mouth daily. Recommend mag oxide brand-name his elbow over-the-counter yet 01/20/15  Yes Frederich Cha, MD  meclizine (ANTIVERT) 12.5 MG tablet Take 1 tablet by mouth 2 (two) times daily.   Yes Historical Provider, MD  [employer] (BLACK CHERRY CONCENTRATE) LIQD Take 15 mLs by mouth 1 day or 1 dose. For gout  Yes Historical Provider, MD  mupirocin ointment (BACTROBAN) 2 % Apply 1 application topically 3 (three) times daily. 01/20/15  Yes Frederich Cha, MD  tiotropium (SPIRIVA) 18 MCG inhalation capsule Place 1 capsule into inhaler and inhale 1 day or 1 dose. 11/27/13  Yes Historical Provider, MD   XARELTO 20 MG TABS tablet TAKE ONE TABLET BY MOUTH ONCE DAILY 03/03/15  Yes Glean Hess, MD    No Known Allergies  Past Surgical History  Procedure Laterality Date  . Cholecystectomy    . Tubal ligation    . Vocal cord polyp excision      Social History  Substance Use Topics  . Smoking status: Former Smoker -- 0.25 packs/day for 50 years    Types: Cigarettes    Quit date: 04/29/2012  . Smokeless tobacco: None  . Alcohol Use: 1.2 oz/week    2 Standard drinks or equivalent per week     Medication list has been reviewed and updated.   Physical Exam  Constitutional: She is oriented to person, place, and time. She appears well-developed and well-nourished. No distress.  HENT:  Head: Normocephalic and atraumatic.  Eyes: Conjunctivae are normal. Right eye exhibits no discharge. Left eye exhibits no discharge. No scleral icterus.  Neck: Normal range of motion. Neck supple. No thyromegaly present.  Cardiovascular: Normal rate, regular rhythm and normal heart sounds.   Pulmonary/Chest: Effort normal. She has decreased breath sounds. She has no wheezes.  Abdominal: Soft. Normal appearance.  Musculoskeletal: Normal range of motion. She exhibits no edema.  Neurological: She is alert and oriented to person, place, and time.  Skin: Skin is warm and dry. Lesion noted. No rash noted.     Psychiatric: She has a normal mood and affect. Her speech is normal and behavior is normal. Thought content normal.    BP 110/70 mmHg  Pulse 84  Ht 4' 11.5" (1.511 m)  Wt 168 lb 12.8 oz (76.567 kg)  BMI 33.54 kg/m2  Assessment and Plan: 1. Pulmonary emphysema, unspecified emphysema type ([place]) Continue current maintenance medications and O2 by nasal cannula - albuterol (PROVENTIL HFA;VENTOLIN HFA) 108 (90 BASE) MCG/ACT inhaler; Inhale 2 puffs into the lungs every 6 (six) hours as needed for wheezing or shortness of breath.  Dispense: 1 Inhaler; Refill: 12  2. Chronic diastolic CHF  (congestive heart failure) (HCC) Recommend follow-up with cardiology  3. Bilateral pulmonary embolism (H[facility]) Continue anticoagulation with Xarelto  4. Essential hypertension Controlled on current regimen - Comprehensive metabolic panel - TSH  5. Polycythemia Will check CBC; if stable will continue to monitor rather than referring back to hematology - CBC with Differential/Platelet  6. Need for influenza vaccination - Flu Vaccine QUAD 36+ mos IM   Halina Maidens, MD [facility]  03/10/2015

## 2015-03-11 LAB — COMPREHENSIVE METABOLIC PANEL WITH GFR
ALT: 8 IU/L (ref 0–32)
AST: 11 IU/L (ref 0–40)
Albumin/Globulin Ratio: 1.4 (ref 1.1–2.5)
Albumin: 3.7 g/dL (ref 3.5–4.8)
Alkaline Phosphatase: 102 IU/L (ref 39–117)
BUN/Creatinine Ratio: 22 (ref 11–26)
BUN: 14 mg/dL (ref 8–27)
Bilirubin Total: 0.7 mg/dL (ref 0.0–1.2)
CO2: 36 mmol/L — ABNORMAL HIGH (ref 18–29)
Calcium: 9.4 mg/dL (ref 8.7–10.3)
Chloride: 98 mmol/L (ref 97–106)
Creatinine, Ser: 0.64 mg/dL (ref 0.57–1.00)
GFR calc Af Amer: 105 mL/min/1.73
GFR calc non Af Amer: 91 mL/min/1.73
Globulin, Total: 2.7 g/dL (ref 1.5–4.5)
Glucose: 81 mg/dL (ref 65–99)
Potassium: 4.7 mmol/L (ref 3.5–5.2)
Sodium: 147 mmol/L — ABNORMAL HIGH (ref 136–144)
Total Protein: 6.4 g/dL (ref 6.0–8.5)

## 2015-03-11 LAB — CBC WITH DIFFERENTIAL/PLATELET
Basophils Absolute: 0 x10E3/uL (ref 0.0–0.2)
Basos: 0 %
EOS (ABSOLUTE): 0.1 x10E3/uL (ref 0.0–0.4)
Eos: 2 %
Hematocrit: 52 % — ABNORMAL HIGH (ref 34.0–46.6)
Hemoglobin: 15.7 g/dL (ref 11.1–15.9)
Immature Grans (Abs): 0 x10E3/uL (ref 0.0–0.1)
Immature Granulocytes: 0 %
Lymphocytes Absolute: 1.6 x10E3/uL (ref 0.7–3.1)
Lymphs: 21 %
MCH: 25.7 pg — ABNORMAL LOW (ref 26.6–33.0)
MCHC: 30.2 g/dL — ABNORMAL LOW (ref 31.5–35.7)
MCV: 85 fL (ref 79–97)
Monocytes Absolute: 0.5 x10E3/uL (ref 0.1–0.9)
Monocytes: 7 %
Neutrophils Absolute: 5.1 x10E3/uL (ref 1.4–7.0)
Neutrophils: 70 %
Platelets: 229 x10E3/uL (ref 150–379)
RBC: 6.1 x10E6/uL — ABNORMAL HIGH (ref 3.77–5.28)
RDW: 17.4 % — ABNORMAL HIGH (ref 12.3–15.4)
WBC: 7.3 x10E3/uL (ref 3.4–10.8)

## 2015-03-11 LAB — TSH: TSH: 1.79 u[IU]/mL (ref 0.450–4.500)

## 2015-04-10 ENCOUNTER — Telehealth: Payer: Self-pay | Admitting: Cardiovascular Disease

## 2015-04-10 NOTE — Telephone Encounter (Signed)
3 attempts to schedule from recall list.  LMOV to call office.  Deleting Recall.

## 2015-05-01 ENCOUNTER — Telehealth: Payer: Self-pay | Admitting: *Deleted

## 2015-05-01 NOTE — Telephone Encounter (Signed)
Left voicemail for patient notifyng them that it is time to schedule annual low dose lung cancer screening CT scan. Instructed patient to call back to verify information prior to the scan being scheduled.  

## 2015-05-09 ENCOUNTER — Telehealth: Payer: Self-pay | Admitting: Internal Medicine

## 2015-05-09 ENCOUNTER — Telehealth: Payer: Self-pay

## 2015-05-09 NOTE — Telephone Encounter (Signed)
done

## 2015-05-09 NOTE — Telephone Encounter (Signed)
Erroneous note

## 2015-07-21 ENCOUNTER — Encounter: Payer: Self-pay | Admitting: *Deleted

## 2015-09-04 ENCOUNTER — Telehealth: Payer: Self-pay | Admitting: *Deleted

## 2015-09-04 NOTE — Telephone Encounter (Signed)
Left voicemail notifying patient it is time for her recommended low dose lung cancer screening CT scan . Requested that she call back to verify information prior to scan being scheduled.

## 2015-09-05 ENCOUNTER — Encounter: Payer: Self-pay | Admitting: *Deleted

## 2015-09-05 NOTE — OR Nursing (Signed)
Pt called regarding scheduling a "lung test", reviewed lung cancer note, called cancer center, they will call pt back to clarify message left for pt regarding lung test.

## 2015-09-07 ENCOUNTER — Ambulatory Visit: Payer: Medicare Other | Admitting: Internal Medicine

## 2015-09-08 ENCOUNTER — Encounter: Payer: Self-pay | Admitting: Internal Medicine

## 2015-09-08 ENCOUNTER — Ambulatory Visit (INDEPENDENT_AMBULATORY_CARE_PROVIDER_SITE_OTHER): Payer: Medicare Other | Admitting: Internal Medicine

## 2015-09-08 VITALS — BP 140/78 | HR 68 | Ht 59.5 in | Wt 163.0 lb

## 2015-09-08 DIAGNOSIS — Z720 Tobacco use: Secondary | ICD-10-CM

## 2015-09-08 DIAGNOSIS — E785 Hyperlipidemia, unspecified: Secondary | ICD-10-CM

## 2015-09-08 DIAGNOSIS — Z7901 Long term (current) use of anticoagulants: Secondary | ICD-10-CM | POA: Diagnosis not present

## 2015-09-08 DIAGNOSIS — Z23 Encounter for immunization: Secondary | ICD-10-CM | POA: Diagnosis not present

## 2015-09-08 DIAGNOSIS — Z0000 Encounter for general adult medical examination without abnormal findings: Secondary | ICD-10-CM | POA: Diagnosis not present

## 2015-09-08 DIAGNOSIS — I5032 Chronic diastolic (congestive) heart failure: Secondary | ICD-10-CM

## 2015-09-08 DIAGNOSIS — I2699 Other pulmonary embolism without acute cor pulmonale: Secondary | ICD-10-CM

## 2015-09-08 DIAGNOSIS — Z1231 Encounter for screening mammogram for malignant neoplasm of breast: Secondary | ICD-10-CM | POA: Diagnosis not present

## 2015-09-08 DIAGNOSIS — J439 Emphysema, unspecified: Secondary | ICD-10-CM

## 2015-09-08 DIAGNOSIS — I10 Essential (primary) hypertension: Secondary | ICD-10-CM | POA: Diagnosis not present

## 2015-09-08 LAB — POCT URINALYSIS DIPSTICK
BILIRUBIN UA: NEGATIVE
Glucose, UA: NEGATIVE
KETONES UA: NEGATIVE
Leukocytes, UA: NEGATIVE
NITRITE UA: NEGATIVE
PH UA: 5
RBC UA: NEGATIVE
SPEC GRAV UA: 1.015
Urobilinogen, UA: 0.2

## 2015-09-08 MED ORDER — MECLIZINE HCL 12.5 MG PO TABS: 12.5000 mg | ORAL_TABLET | Freq: Two times a day (BID) | ORAL | Status: AC

## 2015-09-08 MED ORDER — BUDESONIDE-FORMOTEROL FUMARATE 160-4.5 MCG/ACT IN AERO: 2.0000 | INHALATION_SPRAY | Freq: Two times a day (BID) | RESPIRATORY_TRACT | Status: AC

## 2015-09-08 MED ORDER — IPRATROPIUM-ALBUTEROL 0.5-2.5 (3) MG/3ML IN SOLN: 3.0000 mL | Freq: Four times a day (QID) | RESPIRATORY_TRACT | Status: AC | PRN

## 2015-09-08 NOTE — Patient Instructions (Addendum)
Health Maintenance  Topic Date Due  . Hepatitis C Screening  1945/10/31  . TETANUS/TDAP  02/25/1964  . ZOSTAVAX  02/24/2005  . PNA vac Low Risk Adult (2 of 2 - PCV13) 05/15/2011  . INFLUENZA VACCINE  12/12/2015  . MAMMOGRAM  02/05/2016  . COLONOSCOPY  11/03/2022  . DEXA SCAN  Completed   Pneumococcal Conjugate Vaccine (PCV13)  1. Why get vaccinated? Vaccination can protect both children and adults from pneumococcal disease. Pneumococcal disease is caused by bacteria that can spread from person to person through close contact. It can cause ear infections, and it can also lead to more serious infections of the:  Lungs (pneumonia),  Blood (bacteremia), and  Covering of the brain and spinal cord (meningitis). Pneumococcal pneumonia is most common among adults. Pneumococcal meningitis can cause deafness and brain damage, and it kills about 1 child in 10 who get it. Anyone can get pneumococcal disease, but children under 52 years of age and adults 65 years and older, people with certain medical conditions, and cigarette smokers are at the highest risk. Before there was a vaccine, the [place] saw:  more than 700 cases of meningitis,  about 13,000 blood infections,  about 5 million ear infections, and  about 200 deaths in children under 5 each year from pneumococcal disease. Since vaccine became available, severe pneumococcal disease in these children has fallen by 88%. About 18,000 older adults die of pneumococcal disease each year in the [place]. Treatment of pneumococcal infections with penicillin and other drugs is not as effective as it used to be, because some strains of the disease have become resistant to these drugs. This makes prevention of the disease, through vaccination, even more important. 2. PCV13 vaccine Pneumococcal conjugate vaccine (called PCV13) protects against 13 types of pneumococcal bacteria. PCV13 is routinely given to children at 2, 4, 6, and 11-26  months of age. It is also recommended for children and adults 22 to 10 years of age with certain health conditions, and for all adults 27 years of age and older. Your doctor can give you details. 3. Some people should not get this vaccine Anyone who has ever had a life-threatening allergic reaction to a dose of this vaccine, to an earlier pneumococcal vaccine called PCV7, or to any vaccine containing diphtheria toxoid (for example, DTaP), should not get PCV13. Anyone with a severe allergy to any component of PCV13 should not get the vaccine. Tell your doctor if the person being vaccinated has any severe allergies. If the person scheduled for vaccination is not feeling well, your healthcare provider might decide to reschedule the shot on another day. 4. Risks of a vaccine reaction With any medicine, including vaccines, there is a chance of reactions. These are usually mild and go away on their own, but serious reactions are also possible. Problems reported following PCV13 varied by age and dose in the series. The most common problems reported among children were:  About half became drowsy after the shot, had a temporary loss of appetite, or had redness or tenderness where the shot was given.  About 1 out of 3 had swelling where the shot was given.  About 1 out of 3 had a mild fever, and about 1 in 20 had a fever over 102.45F.  Up to about 8 out of 10 became fussy or irritable. Adults have reported pain, redness, and swelling where the shot was given; also mild fever, fatigue, headache, chills, or muscle pain. Young children who get PCV13  along with inactivated flu vaccine at the same time may be at increased risk for seizures caused by fever. Ask your doctor for more information. Problems that could happen after any vaccine:  People sometimes faint after a medical procedure, including vaccination. Sitting or lying down for about 15 minutes can help prevent fainting, and injuries caused by a fall.  Tell your doctor if you feel dizzy, or have vision changes or ringing in the ears.  Some older children and adults get severe pain in the shoulder and have difficulty moving the arm where a shot was given. This happens very rarely.  Any medication can cause a severe allergic reaction. Such reactions from a vaccine are very rare, estimated at about 1 in a million doses, and would happen within a few minutes to a few hours after the vaccination. As with any medicine, there is a very small chance of a vaccine causing a serious injury or death. The safety of vaccines is always being monitored. For more information, visit: http://www.aguilar.org/ 5. What if there is a serious reaction? What should I look for?  Look for anything that concerns you, such as signs of a severe allergic reaction, very high fever, or unusual behavior. Signs of a severe allergic reaction can include hives, swelling of the face and throat, difficulty breathing, a fast heartbeat, dizziness, and weakness-usually within a few minutes to a few hours after the vaccination. What should I do?  If you think it is a severe allergic reaction or other emergency that can't wait, call 9-1-1 or get the person to the nearest hospital. Otherwise, call your doctor. Reactions should be reported to the Vaccine Adverse Event Reporting System (VAERS). Your doctor should file this report, or you can do it yourself through the VAERS web site at www.vaers.SamedayNews.es, or by calling (450)457-6226. VAERS does not give medical advice. 6. The National Vaccine Injury Compensation Program The [employer] Vaccine Injury Compensation Program (VICP) is a federal program that was created to compensate people who may have been injured by certain vaccines. Persons who believe they may have been injured by a vaccine can learn about the program and about filing a claim by calling 931 434 7364 or visiting the [place] website at GoldCloset.com.ee. There is  a time limit to file a claim for compensation. 7. How can I learn more?  Ask your healthcare provider. He or she can give you the vaccine package insert or suggest other sources of information.  Call your local or state health department.  Contact the Centers for Disease Control and Prevention (CDC):  Call 405-546-8816 (1-800-CDC-INFO) or  Visit CDC's website at http://hunter.com/ Vaccine Information Statement PCV13 Vaccine (03/17/2014)   This information is not intended to replace advice given to you by your health care provider. Make sure you discuss any questions you have with your health care provider.   Document Released: 02/24/2006 Document Revised: 05/20/2014 Document Reviewed: 03/24/2014 Elsevier Interactive Patient Education [employer].

## 2015-09-08 NOTE — Progress Notes (Signed)
Patient: Erica Poole, Female    DOB: 25-Oct-1945, 70 y.o.   MRN: [id number] Visit Date: 09/08/2015  Today's Provider: Halina Maidens, MD   Chief Complaint  Patient presents with  . Medicare Wellness   Subjective:    Annual wellness visit Erica Poole is a 70 y.o. female who presents today for her Subsequent Annual Wellness Visit. She feels fairly well. She reports exercising none. She reports she is sleeping fairly well. She continue to smoke and use Oxygen at night.  She denies being significantly short of breath.  ----------------------------------------------------------- Hypertension This is a chronic problem. The current episode started more than 1 year ago. The problem is unchanged. The problem is controlled. Associated symptoms include shortness of breath. Pertinent negatives include no chest pain, headaches or palpitations.  Hyperlipidemia This is a chronic problem. Associated symptoms include shortness of breath. Pertinent negatives include no chest pain. Current antihyperlipidemic treatment includes statins. The current treatment provides significant improvement of lipids. Compliance problems include adherence to exercise.  Risk factors for coronary artery disease include dyslipidemia and hypertension.  CHF - she has not seen Cardiology recently.  She can not exercise due to lung disease but does not feel that she is worsening.  She denies chest pains, palpitations or trouble sleeping. DVT/PE - has not had any recurrence that she is aware of.  She continues on anticoagulation and takes it regularly. COPD - she continues to smoke and has no interest in quitting.  She uses Oxygen at night.  She uses symbicort some of the time.  She is out of her nebulizer medication.  Review of Systems  Constitutional: Negative for fever, chills, fatigue and unexpected weight change.  HENT: Negative for hearing loss, tinnitus and trouble swallowing.   Eyes: Negative for visual disturbance.    Respiratory: Positive for shortness of breath and wheezing. Negative for cough, choking and chest tightness.   Cardiovascular: Negative for chest pain, palpitations and leg swelling.  Gastrointestinal: Negative for abdominal pain and blood in stool.  Genitourinary: Negative for dysuria, hematuria and difficulty urinating.  Musculoskeletal: Negative for arthralgias.  Neurological: Positive for dizziness. Negative for syncope and headaches.  Psychiatric/Behavioral: Negative for suicidal ideas and dysphoric mood.    Social History   Social History  . Marital Status: Widowed    Spouse Name: N/A  . Number of Children: N/A  . Years of Education: N/A   Occupational History  . Not on file.   Social History Main Topics  . Smoking status: Current Every Day Smoker -- 1.00 packs/day for 50 years    Types: Cigarettes    Last Attempt to Quit: 04/29/2012  . Smokeless tobacco: Not on file  . Alcohol Use: 1.2 oz/week    2 Standard drinks or equivalent per week  . Drug Use: No  . Sexual Activity: Not on file   Other Topics Concern  . Not on file   Social History Narrative    Patient Active Problem List   Diagnosis Date Noted  . Controlled gout 01/27/2015  . Hyperlipidemia 01/27/2015  . Personal history of tobacco use, presenting hazards to health 01/23/2015  . Bilateral pulmonary embolism ([place]) 05/20/2012  . Left leg DVT ([place]) 05/20/2012  . Hypertension   . COPD (chronic obstructive pulmonary disease) ([place])   . Fibrocystic breast disease   . Tobacco abuse   . Polycythemia   . Chronic diastolic CHF (congestive heart failure) [facility])     Past Surgical History  Procedure Laterality Date  .  Cholecystectomy    . Tubal ligation    . Vocal cord polyp excision      Her family history includes Heart attack in her mother and sister; Heart disease in her mother; Hypertension in her brother; Lung cancer in her sister; Stroke in her father.    Previous Medications   ALBUTEROL (PROVENTIL  HFA;VENTOLIN HFA) 108 (90 BASE) MCG/ACT INHALER    Inhale 2 puffs into the lungs every 6 (six) hours as needed for wheezing or shortness of breath.   ALLOPURINOL (ZYLOPRIM) 100 MG TABLET    Take 1 tablet by mouth 1 day or 1 dose.   ATORVASTATIN (LIPITOR) 20 MG TABLET    TAKE ONE TABLET BY MOUTH AT BEDTIME   FUROSEMIDE (LASIX) 20 MG TABLET    Take 20 mg by mouth as needed.   LISINOPRIL-HYDROCHLOROTHIAZIDE (PRINZIDE,ZESTORETIC) 10-12.5 MG PER TABLET    Take 1 tablet by mouth daily.   MAGNESIUM OXIDE (MAG-OX) 400 MG TABLET    Take 1 tablet (400 mg total) by mouth daily. Recommend mag oxide brand-name his elbow over-the-counter yet   MISC NATURAL PRODUCTS (BLACK CHERRY CONCENTRATE) LIQD    Take 15 mLs by mouth 1 day or 1 dose. For gout   OXYGEN    Inhale 3 L/day into the lungs.   POTASSIUM CHLORIDE (K-DUR) 10 MEQ TABLET    Take 10 mEq by mouth daily.   XARELTO 20 MG TABS TABLET    TAKE ONE TABLET BY MOUTH ONCE DAILY    Patient Care Team: Glean Hess, MD as PCP - General (Internal Medicine) Erby Pian, MD as Referring Physician (Pulmonary Disease) Minna Merritts, MD as Consulting Physician (Cardiology)     Objective:   Vitals: BP 140/78 mmHg  Pulse 68  Ht 4' 11.5" (1.511 m)  Wt 163 lb (73.936 kg)  BMI 32.38 kg/m2  Physical Exam  Constitutional: She is oriented to person, place, and time. She appears well-developed and well-nourished. No distress.  HENT:  Head: Normocephalic and atraumatic.  Right Ear: Tympanic membrane and ear canal normal.  Left Ear: Tympanic membrane and ear canal normal.  Nose: Right sinus exhibits no maxillary sinus tenderness. Left sinus exhibits no maxillary sinus tenderness.  Mouth/Throat: Uvula is midline and oropharynx is clear and moist.  Eyes: Conjunctivae and EOM are normal. Right eye exhibits no discharge. Left eye exhibits no discharge. No scleral icterus.  Neck: Normal range of motion. Carotid bruit is not present. No erythema present. No  thyromegaly present.  Cardiovascular: Normal rate, regular rhythm, normal heart sounds and normal pulses.   Pulmonary/Chest: Effort normal. No respiratory distress. She has decreased breath sounds. She has wheezes. She has no rhonchi. She has no rales. Right breast exhibits no mass, no nipple discharge, no skin change and no tenderness. Left breast exhibits no mass, no nipple discharge, no skin change and no tenderness.  Abdominal: Soft. Normal appearance and bowel sounds are normal. There is no hepatosplenomegaly. There is no tenderness. There is no CVA tenderness.  Musculoskeletal:       Right knee: She exhibits normal range of motion, no swelling and no effusion.       Left knee: She exhibits normal range of motion, no swelling and no effusion.  Lymphadenopathy:    She has no cervical adenopathy.    She has no axillary adenopathy.  Neurological: She is alert and oriented to person, place, and time. She has normal reflexes. No cranial nerve deficit or sensory deficit.  Subjective decreased  sensation in hands - but able to feel light touch bilaterally  Skin: Skin is warm, dry and intact. No rash noted.  Psychiatric: She has a normal mood and affect. Her speech is normal and behavior is normal. Thought content normal.  Nursing note and vitals reviewed.   Activities of Daily Living In your present state of health, do you have any difficulty performing the following activities: 09/08/2015  Hearing? N  Vision? Y  Difficulty concentrating or making decisions? Y  Walking or climbing stairs? N  Dressing or bathing? N  Doing errands, shopping? N    Fall Risk Assessment Fall Risk  09/08/2015 05/11/2015  Falls in the past year? No No      Depression Screen PHQ 2/9 Scores 09/08/2015  PHQ - 2 Score 1    Cognitive Testing - 6-CIT   Correct? Score   What year is it? yes 0 Yes = 0    No = 4  What month is it? yes 0 Yes = 0    No = 3  Remember:     [address]ia [address], [place], [place]       What time is it? yes 0 Yes = 0    No = 3  Count backwards from 20 to 1 yes 0 Correct = 0    1 error = 2   More than 1 error = 4  Say the months of the year in reverse. yes 0 Correct = 0    1 error = 2   More than 1 error = 4  What address did I ask you to remember? no 3 Correct = 0  1 error = 2    2 error = 4    3 error = 6    4 error = 8    All wrong = 10       TOTAL SCORE  3/28   Interpretation:  Normal  Normal (0-7) Abnormal (8-28)     Medicare Annual Wellness Visit Summary:  Reviewed patient's Family Medical History Reviewed and updated list of patient's medical providers Assessment of cognitive impairment was done Assessed patient's functional ability Established a written schedule for health screening [place] Completed and Reviewed  Exercise Activities and Dietary recommendations Goals    None      Immunization History  Administered Date(s) Administered  . Influenza,inj,Quad PF,36+ Mos 03/10/2015  . Pneumococcal Polysaccharide-23 05/14/2010    Health Maintenance  Topic Date Due  . Hepatitis C Screening  [birth year]  . TETANUS/TDAP  02/25/1964  . ZOSTAVAX  02/24/2005  . PNA vac Low Risk Adult (2 of 2 - PCV13) 05/15/2011  . MAMMOGRAM  02/05/2015  . INFLUENZA VACCINE  12/12/2015  . COLONOSCOPY  11/03/2022  . DEXA SCAN  Completed     Discussed health benefits of physical activity, and encouraged her to engage in regular exercise appropriate for her age and condition.    ------------------------------------------------------------------------------------------------------------   Assessment & Plan:  1. Medicare annual wellness visit, subsequent Measures satisfied  2. Essential hypertension controlled - CBC with Differential/Platelet - Comprehensive metabolic panel - TSH - POCT urinalysis dipstick  3. Pulmonary emphysema, unspecified emphysema type ([place]) Continues to smoke and poorly compliant with maintenance medication - pt urged  to use symbicort daily - ipratropium-albuterol (DUONEB) 0.5-2.5 (3) MG/3ML SOLN; Take 3 mLs by nebulization every 6 (six) hours as needed.  Dispense: 120 mL; Refill: 12 - budesonide-formoterol (SYMBICORT) 160-4.5 MCG/ACT inhaler; Inhale 2 puffs into the lungs 2 (  two) times daily.  Dispense: 1 Inhaler; Refill: 12  4. Chronic diastolic CHF (congestive heart failure) ([place]) Continue medication; salt restriction Follow up with Cardiology annually  5. Hyperlipidemia On statin therapy - Lipid panel  6. Tobacco abuse Not motivated to quit at this time  7. Bilateral pulmonary embolism (H[facility]) On oral anticoagulation  8. Encounter for screening mammogram for breast cancer - MM DIGITAL SCREENING BILATERAL; Future  9. Need for pneumococcal vaccination - Pneumococcal conjugate vaccine 13-valent IM  10. On continuous oral anticoagulation Monitor for bleeding complications  Halina Maidens, MD [facility]  09/08/2015

## 2015-09-09 LAB — LIPID PANEL
CHOLESTEROL TOTAL: 243 mg/dL — AB (ref 100–199)
Chol/HDL Ratio: 4.3 ratio units (ref 0.0–4.4)
HDL: 57 mg/dL (ref >39)
LDL Calculated: 156 mg/dL — ABNORMAL HIGH (ref 0–99)
Triglycerides: 148 mg/dL (ref 0–149)
VLDL CHOLESTEROL CAL: 30 mg/dL (ref 5–40)

## 2015-09-09 LAB — CBC WITH DIFFERENTIAL/PLATELET
BASOS: 1 %
Basophils Absolute: 0 10*3/uL (ref 0.0–0.2)
EOS (ABSOLUTE): 0.1 10*3/uL (ref 0.0–0.4)
Eos: 2 %
HEMOGLOBIN: 17.6 g/dL — AB (ref 11.1–15.9)
Hematocrit: 55.9 % — ABNORMAL HIGH (ref 34.0–46.6)
IMMATURE GRANS (ABS): 0 10*3/uL (ref 0.0–0.1)
Immature Granulocytes: 0 %
LYMPHS ABS: 1.4 10*3/uL (ref 0.7–3.1)
Lymphs: 22 %
MCH: 26.6 pg (ref 26.6–33.0)
MCHC: 31.5 g/dL (ref 31.5–35.7)
MCV: 85 fL (ref 79–97)
MONOCYTES: 8 %
Monocytes Absolute: 0.5 10*3/uL (ref 0.1–0.9)
NEUTROS ABS: 4.3 10*3/uL (ref 1.4–7.0)
NEUTROS PCT: 67 %
Platelets: 203 10*3/uL (ref 150–379)
RBC: 6.61 x10E6/uL — ABNORMAL HIGH (ref 3.77–5.28)
RDW: 20.8 % — ABNORMAL HIGH (ref 12.3–15.4)
WBC: 6.4 10*3/uL (ref 3.4–10.8)

## 2015-09-09 LAB — COMPREHENSIVE METABOLIC PANEL
A/G RATIO: 1.3 (ref 1.2–2.2)
ALBUMIN: 3.9 g/dL (ref 3.5–4.8)
ALK PHOS: 101 IU/L (ref 39–117)
ALT: 12 IU/L (ref 0–32)
AST: 14 IU/L (ref 0–40)
BILIRUBIN TOTAL: 0.7 mg/dL (ref 0.0–1.2)
BUN / CREAT RATIO: 14 (ref 12–28)
BUN: 10 mg/dL (ref 8–27)
CHLORIDE: 96 mmol/L (ref 96–106)
CO2: 32 mmol/L — ABNORMAL HIGH (ref 18–29)
Calcium: 9.9 mg/dL (ref 8.7–10.3)
Creatinine, Ser: 0.69 mg/dL (ref 0.57–1.00)
GFR calc non Af Amer: 88 mL/min/{1.73_m2} (ref >59)
GFR, EST AFRICAN AMERICAN: 102 mL/min/{1.73_m2} (ref >59)
GLOBULIN, TOTAL: 3 g/dL (ref 1.5–4.5)
Glucose: 96 mg/dL (ref 65–99)
POTASSIUM: 4.3 mmol/L (ref 3.5–5.2)
SODIUM: 146 mmol/L — AB (ref 134–144)
TOTAL PROTEIN: 6.9 g/dL (ref 6.0–8.5)

## 2015-09-09 LAB — TSH: TSH: 2.57 u[IU]/mL (ref 0.450–4.500)

## 2015-09-11 ENCOUNTER — Telehealth: Payer: Self-pay | Admitting: *Deleted

## 2015-09-11 NOTE — Telephone Encounter (Signed)
Patient's daughter called back requesting I make another call to patient explaining that her annual  follow up lung cancer screening low dose CT scan is due. Confirmed that patient is within age range of 55-77, asymptomatic of lung cancer, and no other serious disease processes that would make treatment of lung cancer not possible. The patient is a  current smoker with a 62 pack year history. The shared decision making visit was completed 12/31/2013. The patient is agreeable for CT scan to be scheduled. She states it is hard for her to schedule tests due to boyfriend working 12 Hour night shifts and he is her driver.

## 2015-09-15 ENCOUNTER — Other Ambulatory Visit: Payer: Self-pay | Admitting: Family Medicine

## 2015-09-15 DIAGNOSIS — Z87891 Personal history of nicotine dependence: Secondary | ICD-10-CM

## 2015-09-29 ENCOUNTER — Ambulatory Visit: Admission: RE | Admit: 2015-09-29 | Payer: Medicare Other | Source: Ambulatory Visit

## 2015-12-20 ENCOUNTER — Inpatient Hospital Stay
Admission: EM | Admit: 2015-12-20 | Discharge: 2015-12-22 | DRG: 189 | Disposition: A | Discharge status: Home Health | Payer: Medicare Other | Attending: Internal Medicine | Admitting: Internal Medicine

## 2015-12-20 ENCOUNTER — Encounter: Payer: Self-pay | Admitting: Emergency Medicine

## 2015-12-20 ENCOUNTER — Emergency Department: Payer: Medicare Other

## 2015-12-20 DIAGNOSIS — I5032 Chronic diastolic (congestive) heart failure: Secondary | ICD-10-CM | POA: Diagnosis present

## 2015-12-20 DIAGNOSIS — R1031 Right lower quadrant pain: Secondary | ICD-10-CM | POA: Diagnosis not present

## 2015-12-20 DIAGNOSIS — Z86718 Personal history of other venous thrombosis and embolism: Secondary | ICD-10-CM | POA: Diagnosis not present

## 2015-12-20 DIAGNOSIS — Z86711 Personal history of pulmonary embolism: Secondary | ICD-10-CM | POA: Diagnosis not present

## 2015-12-20 DIAGNOSIS — D45 Polycythemia vera: Secondary | ICD-10-CM | POA: Diagnosis present

## 2015-12-20 DIAGNOSIS — D751 Secondary polycythemia: Secondary | ICD-10-CM | POA: Diagnosis present

## 2015-12-20 DIAGNOSIS — E785 Hyperlipidemia, unspecified: Secondary | ICD-10-CM | POA: Diagnosis present

## 2015-12-20 DIAGNOSIS — R1030 Lower abdominal pain, unspecified: Secondary | ICD-10-CM | POA: Diagnosis not present

## 2015-12-20 DIAGNOSIS — J9622 Acute and chronic respiratory failure with hypercapnia: Secondary | ICD-10-CM | POA: Diagnosis present

## 2015-12-20 DIAGNOSIS — J9621 Acute and chronic respiratory failure with hypoxia: Secondary | ICD-10-CM | POA: Diagnosis present

## 2015-12-20 DIAGNOSIS — Z9981 Dependence on supplemental oxygen: Secondary | ICD-10-CM | POA: Diagnosis not present

## 2015-12-20 DIAGNOSIS — J9602 Acute respiratory failure with hypercapnia: Secondary | ICD-10-CM

## 2015-12-20 DIAGNOSIS — I110 Hypertensive heart disease with heart failure: Secondary | ICD-10-CM | POA: Diagnosis present

## 2015-12-20 DIAGNOSIS — J441 Chronic obstructive pulmonary disease with (acute) exacerbation: Secondary | ICD-10-CM | POA: Diagnosis present

## 2015-12-20 DIAGNOSIS — M109 Gout, unspecified: Secondary | ICD-10-CM | POA: Diagnosis present

## 2015-12-20 DIAGNOSIS — F17210 Nicotine dependence, cigarettes, uncomplicated: Secondary | ICD-10-CM | POA: Diagnosis present

## 2015-12-20 DIAGNOSIS — J9601 Acute respiratory failure with hypoxia: Secondary | ICD-10-CM

## 2015-12-20 LAB — URINALYSIS COMPLETE WITH MICROSCOPIC (ARMC ONLY)
BACTERIA UA: NONE SEEN
Bilirubin Urine: NEGATIVE
Glucose, UA: NEGATIVE mg/dL
Hgb urine dipstick: NEGATIVE
KETONES UR: NEGATIVE mg/dL
Leukocytes, UA: NEGATIVE
NITRITE: NEGATIVE
Specific Gravity, Urine: 1.017 (ref 1.005–1.030)
pH: 5 (ref 5.0–8.0)

## 2015-12-20 LAB — CBC
HCT: 61.6 % — ABNORMAL HIGH (ref 35.0–47.0)
Hemoglobin: 20.4 g/dL — ABNORMAL HIGH (ref 12.0–16.0)
MCH: 28.7 pg (ref 26.0–34.0)
MCHC: 33.1 g/dL (ref 32.0–36.0)
MCV: 86.9 fL (ref 80.0–100.0)
Platelets: 174 10*3/uL (ref 150–440)
RBC: 7.09 MIL/uL — ABNORMAL HIGH (ref 3.80–5.20)
RDW: 19.1 % — AB (ref 11.5–14.5)
WBC: 7.7 10*3/uL (ref 3.6–11.0)

## 2015-12-20 LAB — COMPREHENSIVE METABOLIC PANEL
ALBUMIN: 3.3 g/dL — AB (ref 3.5–5.0)
ALK PHOS: 99 U/L (ref 38–126)
ALT: 11 U/L — AB (ref 14–54)
AST: 15 U/L (ref 15–41)
Anion gap: 5 (ref 5–15)
BILIRUBIN TOTAL: 0.2 mg/dL — AB (ref 0.3–1.2)
BUN: 11 mg/dL (ref 6–20)
CALCIUM: 9.5 mg/dL (ref 8.9–10.3)
CO2: 37 mmol/L — ABNORMAL HIGH (ref 22–32)
Chloride: 100 mmol/L — ABNORMAL LOW (ref 101–111)
Creatinine, Ser: 0.81 mg/dL (ref 0.44–1.00)
GFR calc Af Amer: >60 mL/min (ref >60)
GFR calc non Af Amer: >60 mL/min (ref >60)
GLUCOSE: 98 mg/dL (ref 65–99)
Potassium: 4.1 mmol/L (ref 3.5–5.1)
Sodium: 142 mmol/L (ref 135–145)
TOTAL PROTEIN: 7.1 g/dL (ref 6.5–8.1)

## 2015-12-20 LAB — URINE DRUG SCREEN, QUALITATIVE (ARMC ONLY)
AMPHETAMINES, UR SCREEN: NOT DETECTED
BENZODIAZEPINE, UR SCRN: NOT DETECTED
Barbiturates, Ur Screen: NOT DETECTED
Cannabinoid 50 Ng, Ur ~~LOC~~: NOT DETECTED
Cocaine Metabolite,Ur ~~LOC~~: NOT DETECTED
MDMA (ECSTASY) UR SCREEN: NOT DETECTED
METHADONE SCREEN, URINE: NOT DETECTED
Opiate, Ur Screen: NOT DETECTED
PHENCYCLIDINE (PCP) UR S: NOT DETECTED
Tricyclic, Ur Screen: NOT DETECTED

## 2015-12-20 LAB — BLOOD GAS, VENOUS
ACID-BASE EXCESS: 7.5 mmol/L — AB (ref 0.0–3.0)
BICARBONATE: 40.4 meq/L — AB (ref 21.0–28.0)
PCO2 VEN: 90 mmHg — AB (ref 44.0–60.0)
PH VEN: 7.26 — AB (ref 7.320–7.430)
Patient temperature: 37

## 2015-12-20 LAB — LIPASE, BLOOD: Lipase: 23 U/L (ref 11–51)

## 2015-12-20 LAB — GLUCOSE, CAPILLARY: GLUCOSE-CAPILLARY: 90 mg/dL (ref 65–99)

## 2015-12-20 LAB — MRSA PCR SCREENING: MRSA BY PCR: NEGATIVE

## 2015-12-20 LAB — TROPONIN I: Troponin I: <0.03 ng/mL (ref <0.03)

## 2015-12-20 MED ORDER — RIVAROXABAN 10 MG PO TABS
20.0000 mg | ORAL_TABLET | Freq: Every day | ORAL | Status: DC
  Administered 2015-12-20 – 2015-12-21 (×2): 20 mg via ORAL
  Filled 2015-12-20 (×2): qty 2

## 2015-12-20 MED ORDER — IOPAMIDOL (ISOVUE-300) INJECTION 61%
100.0000 mL | Freq: Once | INTRAVENOUS | Status: AC | PRN
  Administered 2015-12-20: 100 mL via INTRAVENOUS

## 2015-12-20 MED ORDER — SODIUM CHLORIDE 0.9 % IV SOLN
Freq: Once | INTRAVENOUS | Status: AC
  Administered 2015-12-20: 12:00:00 via INTRAVENOUS

## 2015-12-20 MED ORDER — LABETALOL HCL 5 MG/ML IV SOLN
10.0000 mg | Freq: Once | INTRAVENOUS | Status: AC
  Administered 2015-12-20: 10 mg via INTRAVENOUS
  Filled 2015-12-20: qty 4

## 2015-12-20 MED ORDER — ALLOPURINOL 100 MG PO TABS
100.0000 mg | ORAL_TABLET | Freq: Every day | ORAL | Status: DC
  Administered 2015-12-20 – 2015-12-22 (×3): 100 mg via ORAL
  Filled 2015-12-20 (×3): qty 1

## 2015-12-20 MED ORDER — METHYLPREDNISOLONE SODIUM SUCC 40 MG IJ SOLR
40.0000 mg | Freq: Four times a day (QID) | INTRAMUSCULAR | Status: DC
  Administered 2015-12-20 – 2015-12-22 (×6): 40 mg via INTRAVENOUS
  Filled 2015-12-20 (×7): qty 1

## 2015-12-20 MED ORDER — CHLORHEXIDINE GLUCONATE 0.12 % MT SOLN
15.0000 mL | Freq: Two times a day (BID) | OROMUCOSAL | Status: DC
  Administered 2015-12-20 – 2015-12-22 (×5): 15 mL via OROMUCOSAL
  Filled 2015-12-20 (×4): qty 15

## 2015-12-20 MED ORDER — DIATRIZOATE MEGLUMINE & SODIUM 66-10 % PO SOLN
15.0000 mL | Freq: Once | ORAL | Status: AC
  Administered 2015-12-20: 15 mL via ORAL

## 2015-12-20 MED ORDER — ONDANSETRON HCL 4 MG PO TABS: 4.0000 mg | ORAL_TABLET | Freq: Four times a day (QID) | ORAL | Status: DC | PRN

## 2015-12-20 MED ORDER — LISINOPRIL 10 MG PO TABS
10.0000 mg | ORAL_TABLET | Freq: Every day | ORAL | Status: DC
  Administered 2015-12-20 – 2015-12-21 (×2): 10 mg via ORAL
  Filled 2015-12-20 (×3): qty 1

## 2015-12-20 MED ORDER — POTASSIUM CHLORIDE CRYS ER 10 MEQ PO TBCR
10.0000 meq | EXTENDED_RELEASE_TABLET | Freq: Every day | ORAL | Status: DC
  Administered 2015-12-20 – 2015-12-22 (×3): 10 meq via ORAL
  Filled 2015-12-20 (×4): qty 1

## 2015-12-20 MED ORDER — FUROSEMIDE 20 MG PO TABS
20.0000 mg | ORAL_TABLET | Freq: Every day | ORAL | Status: DC
  Administered 2015-12-20 – 2015-12-22 (×3): 20 mg via ORAL
  Filled 2015-12-20 (×3): qty 1

## 2015-12-20 MED ORDER — ONDANSETRON HCL 4 MG/2ML IJ SOLN: 4.0000 mg | Freq: Four times a day (QID) | INTRAMUSCULAR | Status: DC | PRN

## 2015-12-20 MED ORDER — CETYLPYRIDINIUM CHLORIDE 0.05 % MT LIQD
7.0000 mL | Freq: Two times a day (BID) | OROMUCOSAL | Status: DC
  Administered 2015-12-20 – 2015-12-21 (×2): 7 mL via OROMUCOSAL

## 2015-12-20 MED ORDER — BUDESONIDE 0.25 MG/2ML IN SUSP
0.2500 mg | Freq: Two times a day (BID) | RESPIRATORY_TRACT | Status: DC
  Administered 2015-12-20 – 2015-12-22 (×4): 0.25 mg via RESPIRATORY_TRACT
  Filled 2015-12-20 (×4): qty 2

## 2015-12-20 MED ORDER — ACETAMINOPHEN 650 MG RE SUPP: 650.0000 mg | Freq: Four times a day (QID) | RECTAL | Status: DC | PRN

## 2015-12-20 MED ORDER — MECLIZINE HCL 25 MG PO TABS
12.5000 mg | ORAL_TABLET | Freq: Two times a day (BID) | ORAL | Status: DC
  Administered 2015-12-20 – 2015-12-22 (×4): 12.5 mg via ORAL
  Filled 2015-12-20 (×5): qty 1

## 2015-12-20 MED ORDER — ATORVASTATIN CALCIUM 20 MG PO TABS
20.0000 mg | ORAL_TABLET | Freq: Every day | ORAL | Status: DC
  Administered 2015-12-20 – 2015-12-22 (×3): 20 mg via ORAL
  Filled 2015-12-20 (×3): qty 1

## 2015-12-20 MED ORDER — MORPHINE SULFATE (PF) 4 MG/ML IV SOLN
4.0000 mg | Freq: Once | INTRAVENOUS | Status: AC
  Administered 2015-12-20: 4 mg via INTRAVENOUS
  Filled 2015-12-20: qty 1

## 2015-12-20 MED ORDER — HYDROCHLOROTHIAZIDE 12.5 MG PO CAPS
12.5000 mg | ORAL_CAPSULE | Freq: Every day | ORAL | Status: DC
  Administered 2015-12-20 – 2015-12-22 (×3): 12.5 mg via ORAL
  Filled 2015-12-20 (×3): qty 1

## 2015-12-20 MED ORDER — ACETAMINOPHEN 325 MG PO TABS
650.0000 mg | ORAL_TABLET | Freq: Four times a day (QID) | ORAL | Status: DC | PRN
  Administered 2015-12-21: 650 mg via ORAL
  Filled 2015-12-20: qty 2

## 2015-12-20 MED ORDER — IPRATROPIUM-ALBUTEROL 0.5-2.5 (3) MG/3ML IN SOLN
3.0000 mL | Freq: Four times a day (QID) | RESPIRATORY_TRACT | Status: DC
  Administered 2015-12-20 – 2015-12-22 (×8): 3 mL via RESPIRATORY_TRACT
  Filled 2015-12-20 (×9): qty 3

## 2015-12-20 MED ORDER — LISINOPRIL-HYDROCHLOROTHIAZIDE 10-12.5 MG PO TABS: 1.0000 | ORAL_TABLET | Freq: Every day | ORAL | Status: DC

## 2015-12-20 MED ORDER — HYDRALAZINE HCL 20 MG/ML IJ SOLN
10.0000 mg | Freq: Four times a day (QID) | INTRAMUSCULAR | Status: DC | PRN
Start: 2015-12-20 — End: 2015-12-22

## 2015-12-20 MED ORDER — MAGNESIUM OXIDE 400 (241.3 MG) MG PO TABS
400.0000 mg | ORAL_TABLET | Freq: Every day | ORAL | Status: DC
  Administered 2015-12-20 – 2015-12-22 (×3): 400 mg via ORAL
  Filled 2015-12-20 (×3): qty 1

## 2015-12-20 NOTE — Progress Notes (Signed)
Patient admission complete, resting comfortably on bipap. Patient able to be taken off bipap intermittently and put on [place] at 4L. No complaints, will continue to assess.  Erica Poole

## 2015-12-20 NOTE — ED Notes (Signed)
Pt reports pain in RLQ states it radiates to her back and down her leg.  Denies n/v/d or urinary sx.

## 2015-12-20 NOTE — ED Notes (Signed)
EDP MD at bedside.

## 2015-12-20 NOTE — ED Triage Notes (Addendum)
Pt with 10/10 RLQ abd pain that started yesterday after walking. Noticed blood in urine last week that resolved. Denies diarrhea.  Pain on 3 LNC as needed at home, was 83 % RA in triage. HTN did not take any meds today

## 2015-12-20 NOTE — ED Notes (Addendum)
Patient tolerating bipap well at this time 

## 2015-12-20 NOTE — ED Provider Notes (Signed)
[facility] Emergency Department Provider Note        Time seen: ----------------------------------------- 11:09 AM on 12/20/2015 -----------------------------------------    I have reviewed the triage vital signs and the nursing notes.   HISTORY  Chief Complaint Abdominal Pain    HPI Erica Poole is a 70 y.o. female who presents to ER for severe right lower quadrant pain that started yesterday after walking. She did notice blood in her urine last week that has resolved. She has had some diarrhea, she denies fevers or vomiting. She is supposed to be on home oxygen all the time. She notes she has a history ofpolycythemia and has not followed up for serial blood draws. She has not had pain like this before.   Past Medical History:  Diagnosis Date  . Bilateral pulmonary embolism ([place])    a. 04/24/2012 -->Started on Xarelto;  b. 04/28/2012 s/p IVC Filter.  . Chronic diastolic CHF (congestive heart failure) ([place])    a. 04/2012 Echo: EF 55%, mildly dil RV w/ mod reduced RV fxn, mild bi-atrial enlargement.  Marland Kitchen COPD (chronic obstructive pulmonary disease) ([place])   . Fibrocystic breast disease   . Gout   . Hypertension   . Left leg DVT ([place])    a. 04/24/2012 -->Started on Xarelto  . Personal history of tobacco use, presenting hazards to health 01/23/2015  . Polycythemia   . Tobacco abuse    a. quit 04/2012    Patient Active Problem List   Diagnosis Date Noted  . On continuous oral anticoagulation 09/08/2015  . Controlled gout 01/27/2015  . Hyperlipidemia 01/27/2015  . Personal history of tobacco use, presenting hazards to health 01/23/2015  . Bilateral pulmonary embolism ([place]) 05/20/2012  . Left leg DVT ([place]) 05/20/2012  . Hypertension   . COPD (chronic obstructive pulmonary disease) ([place])   . Fibrocystic breast disease   . Tobacco abuse   . Polycythemia   . Chronic diastolic CHF (congestive heart failure) ([place])     Past Surgical History:   Procedure Laterality Date  . CHOLECYSTECTOMY    . TUBAL LIGATION    . Vocal cord polyp excision      Allergies Review of patient's allergies indicates no known allergies.  Social History Social History  Substance Use Topics  . Smoking status: Current Every Day Smoker    Packs/day: 1.00    Years: 50.00    Types: Cigarettes    Last attempt to quit: 04/29/2012  . Smokeless tobacco: Not on file  . Alcohol use 1.2 oz/week    2 Standard drinks or equivalent per week    Review of Systems Constitutional: Negative for fever. Eyes: Negative for visual changes. ENT: Negative for sore throat. Cardiovascular: Negative for chest pain. Respiratory: Negative for shortness of breath. Gastrointestinal: Positive for abdominal pain, diarrhea Genitourinary: Negative for dysuria. Musculoskeletal: Positive for back pain Skin: Negative for rash. Neurological: Negative for headaches, focal weakness or numbness.  10-point ROS otherwise negative.  ____________________________________________   PHYSICAL EXAM:  VITAL SIGNS: ED Triage Vitals  Enc Vitals Group     BP 12/20/15 0940 (!) 168/127     Pulse Rate 12/20/15 0940 76     Resp --      Temp 12/20/15 0940 98.2 F (36.8 C)     Temp Source 12/20/15 0940 Oral     SpO2 12/20/15 0940 (!) 83 %     Weight 12/20/15 0942 175 lb (79.4 kg)     Height 12/20/15 0942 4\' 11"  (1.499  m)     Head Circumference --      Peak Flow --      Pain Score 12/20/15 0942 6     Pain Loc --      Pain Edu? --      Excl. in [place]? --     Constitutional: Alert and oriented. Mild distress Eyes: Conjunctivae are injected bilaterally. PERRL. Normal extraocular movements. ENT   Head: Normocephalic and atraumatic.   Nose: No congestion/rhinnorhea.   Mouth/Throat: Mucous membranes are moist.   Neck: No stridor. Cardiovascular: Normal rate, regular rhythm. No murmurs, rubs, or gallops. Respiratory: Prolonged expiration with wheezing. Gastrointestinal:  Right lower quadrant tenderness, no rebound or guarding. Normal bowel sounds. Musculoskeletal: Nontender with normal range of motion in all extremities. No lower extremity tenderness, mild edema Neurologic:  Normal speech and language. No gross focal neurologic deficits are appreciated.  Skin:  Skin is warm, dry and intact. Facial erythema is noted Psychiatric: Mood and affect are normal. Speech and behavior are normal.  ____________________________________________  EKG: Interpreted by me. Sinus rhythm with a rate of 80 bpm,, left axis deviation, normal QRS size, normal QT. No evidence of acute infarction.  ____________________________________________  ED COURSE:  Pertinent labs & imaging results that were available during my care of the patient were reviewed by me and considered in my medical decision making (see chart for details). Clinical Course  Patient presents the ER mainly for abdominal pain. She will need basic labs, likely CT imaging.  Procedures ____________________________________________   LABS (pertinent positives/negatives)  Labs Reviewed  COMPREHENSIVE METABOLIC PANEL - Abnormal; Notable for the following:       Result Value   Chloride 100 (*)    CO2 37 (*)    Albumin 3.3 (*)    ALT 11 (*)    Total Bilirubin 0.2 (*)    All other components within normal limits  CBC - Abnormal; Notable for the following:    RBC 7.09 (*)    Hemoglobin 20.4 (*)    HCT 61.6 (*)    RDW 19.1 (*)    All other components within normal limits  URINALYSIS COMPLETEWITH MICROSCOPIC ([facility] ONLY) - Abnormal; Notable for the following:    Color, Urine YELLOW (*)    APPearance CLEAR (*)    Protein, ur >500 (*)    Squamous Epithelial / LPF 0-5 (*)    All other components within normal limits  BLOOD GAS, VENOUS - Abnormal; Notable for the following:    pH, Ven 7.26 (*)    pCO2, Ven 90 (*)    pO2, Ven <31.0 (*)    Bicarbonate 40.4 (*)    Acid-Base Excess 7.5 (*)    All other components  within normal limits  LIPASE, BLOOD  URINE DRUG SCREEN, QUALITATIVE ([facility] ONLY)   CRITICAL CARE Performed by: Earleen Newport   Total critical care time: 30 minutes  Critical care time was exclusive of separately billable procedures and treating other patients.  Critical care was necessary to treat or prevent imminent or life-threatening deterioration.  Critical care was time spent personally by me on the following activities: development of treatment plan with patient and/or surrogate as well as nursing, discussions with consultants, evaluation of patient's response to treatment, examination of patient, obtaining history from patient or surrogate, ordering and performing treatments and interventions, ordering and review of laboratory studies, ordering and review of radiographic studies, pulse oximetry and re-evaluation of patient's condition.   RADIOLOGY Images were viewed by me  CT of the abdomen and pelvis with contrast IMPRESSION: No acute abnormality or finding to explain the patient's symptoms. The appendix appears normal.  Atherosclerotic vascular disease.  0.3 cm nonobstructing stone upper pole right kidney.  Diverticulosis without diverticulitis.  ____________________________________________  FINAL ASSESSMENT AND PLAN  Polycythemia, abdominal pain, acute on chronic hypercarbia  Plan: Patient with labs and imaging as dictated above. Patient presented with abdominal pain, noted to be wheezing some. Labs suggested likely significant CO2 retention. Venous blood gas confirmed PCO2 of 90. She's been placed on BiPAP. I will also order IV steroids. I will discuss with the hospitalist for admission. Patient may require hematology consult.   Earleen Newport, MD   Note: This dictation was prepared with Dragon dictation. Any transcriptional errors that result from this process are unintentional    Earleen Newport, MD 12/20/15 1322

## 2015-12-20 NOTE — H&P (Signed)
[facility] NAME: Erica Poole    MR#:  [id number]  DATE OF BIRTH:  01-Nov-1945  DATE OF ADMISSION:  12/20/2015  PRIMARY CARE PHYSICIAN: Halina Maidens, MD   REQUESTING/REFERRING PHYSICIAN: Dr. Lenise Arena  CHIEF COMPLAINT:   Chief Complaint  Patient presents with  . Abdominal Pain    HISTORY OF PRESENT ILLNESS:  Erica Poole  is a 70 y.o. female with a known history of COPD, gout, hypertension, previous history of pulmonary embolism/DVT, polycythemia vera, who presents to the hospital complaining of vague abdominal pain. Patient says that she's been having abdominal pain the right side of her belly for the past 2 days. It is not associated with any nausea vomiting or any fever or chills. She denies any constipation or diarrhea. She denies any melena or hematochezia. She denies any chest pain, or any worsening shortness of breath. She is on chronic oxygen for her underlying COPD. Patient underwent a blood gas in the mention which showed significant hypercarbia with some acidosis and hospitalist services were contacted further treatment and evaluation.  PAST MEDICAL HISTORY:   Past Medical History:  Diagnosis Date  . Bilateral pulmonary embolism ([place])    a. 04/24/2012 -->Started on Xarelto;  b. 04/28/2012 s/p IVC Filter.  . Chronic diastolic CHF (congestive heart failure) ([place])    a. 04/2012 Echo: EF 55%, mildly dil RV w/ mod reduced RV fxn, mild bi-atrial enlargement.  Marland Kitchen COPD (chronic obstructive pulmonary disease) ([place])   . Fibrocystic breast disease   . Gout   . Hypertension   . Left leg DVT ([place])    a. 04/24/2012 -->Started on Xarelto  . Personal history of tobacco use, presenting hazards to health 01/23/2015  . Polycythemia   . Tobacco abuse    a. quit 04/2012    PAST SURGICAL HISTORY:   Past Surgical History:  Procedure Laterality Date  . CHOLECYSTECTOMY    . TUBAL LIGATION    . Vocal cord polyp excision       SOCIAL HISTORY:   Social History  Substance Use Topics  . Smoking status: Current Every Day Smoker    Packs/day: 1.00    Years: 50.00    Types: Cigarettes    Last attempt to quit: 04/29/2012  . Smokeless tobacco: Not on file  . Alcohol use 1.2 oz/week    2 Standard drinks or equivalent per week    FAMILY HISTORY:   Family History  Problem Relation Age of Onset  . Heart disease Mother   . Heart attack Mother   . Stroke Father   . Heart attack Sister   . Lung cancer Sister     sister was a smoker  . Hypertension Brother     DRUG ALLERGIES:  No Known Allergies  REVIEW OF SYSTEMS:   Review of Systems  Constitutional: Negative for fever and weight loss.  HENT: Negative for congestion, nosebleeds and tinnitus.   Eyes: Negative for blurred vision, double vision and redness.  Respiratory: Positive for shortness of breath. Negative for cough and hemoptysis.   Cardiovascular: Negative for chest pain, orthopnea, leg swelling and PND.  Gastrointestinal: Positive for abdominal pain. Negative for diarrhea, melena, nausea and vomiting.  Genitourinary: Negative for dysuria, hematuria and urgency.  Musculoskeletal: Negative for falls and joint pain.  Neurological: Negative for dizziness, tingling, sensory change, focal weakness, seizures, weakness and headaches.  Endo/Heme/Allergies: Negative for polydipsia. Does not bruise/bleed easily.  Psychiatric/Behavioral: Negative for depression and memory loss.  The patient is not nervous/anxious.     MEDICATIONS AT HOME:   Prior to Admission medications   Medication Sig Start Date End Date Taking? Authorizing Provider  albuterol (PROVENTIL HFA;VENTOLIN HFA) 108 (90 BASE) MCG/ACT inhaler Inhale 2 puffs into the lungs every 6 (six) hours as needed for wheezing or shortness of breath. 03/10/15  Yes Glean Hess, MD  allopurinol (ZYLOPRIM) 100 MG tablet Take 1 tablet by mouth daily.    Yes Historical Provider, MD  atorvastatin  (LIPITOR) 20 MG tablet Take 20 mg by mouth daily.   Yes Historical Provider, MD  budesonide-formoterol (SYMBICORT) 160-4.5 MCG/ACT inhaler Inhale 2 puffs into the lungs 2 (two) times daily. 09/08/15  Yes Glean Hess, MD  furosemide (LASIX) 20 MG tablet Take 20 mg by mouth daily.    Yes Historical Provider, MD  ipratropium-albuterol (DUONEB) 0.5-2.5 (3) MG/3ML SOLN Take 3 mLs by nebulization every 6 (six) hours as needed. 09/08/15  Yes Glean Hess, MD  lisinopril-hydrochlorothiazide (PRINZIDE,ZESTORETIC) 10-12.5 MG per tablet Take 1 tablet by mouth daily. 01/20/15  Yes Historical Provider, MD  magnesium oxide (MAG-OX) 400 MG tablet Take 1 tablet (400 mg total) by mouth daily. Recommend mag oxide brand-name his elbow over-the-counter yet 01/20/15  Yes Frederich Cha, MD  meclizine (ANTIVERT) 12.5 MG tablet Take 1 tablet (12.5 mg total) by mouth 2 (two) times daily. 09/08/15  Yes Glean Hess, MD  potassium chloride (K-DUR) 10 MEQ tablet Take 10 mEq by mouth daily.   Yes Historical Provider, MD  rivaroxaban (XARELTO) 20 MG TABS tablet Take 20 mg by mouth daily with supper.   Yes Historical Provider, MD  Misc Natural Products (BLACK CHERRY CONCENTRATE) LIQD Take 15 mLs by mouth 1 day or 1 dose. For gout    Historical Provider, MD  OXYGEN Inhale 3 L/day into the lungs.    Historical Provider, MD      VITAL SIGNS:  Blood pressure (!) 225/105, pulse 62, temperature 98.2 F (36.8 C), temperature source Oral, resp. rate 15, height 4\' 11"  (1.499 m), weight 79.4 kg (175 lb), SpO2 91 %.  PHYSICAL EXAMINATION:  Physical Exam  GENERAL:  70 y.o.-year-old patient lying in the bed in no acute distress on Bipap.  EYES: Pupils equal, round, reactive to light and accommodation. No scleral icterus. Extraocular muscles intact.  HEENT: Head atraumatic, normocephalic. Oropharynx and nasopharynx clear. No oropharyngeal erythema, moist oral mucosa  NECK:  Supple, no jugular venous distention. No thyroid  enlargement, no tenderness.  LUNGS: Normal breath sounds bilaterally, no wheezing, rales, rhonchi. No use of accessory muscles of respiration.  CARDIOVASCULAR: S1, S2 RRR. No murmurs, rubs, gallops, clicks.  ABDOMEN: Soft, nontender, nondistended. Bowel sounds present. No organomegaly or mass.  EXTREMITIES: No pedal edema, cyanosis, or clubbing. + 2 pedal & radial pulses b/l.   NEUROLOGIC: Cranial nerves II through XII are intact. No focal Motor or sensory deficits appreciated b/l. PSYCHIATRIC: The patient is alert and oriented x 3. Good affect.  SKIN: No obvious rash, lesion, or ulcer.   LABORATORY PANEL:   CBC  Recent Labs Lab 12/20/15 0950  WBC 7.7  HGB 20.4*  HCT 61.6*  PLT 174   ------------------------------------------------------------------------------------------------------------------  Chemistries   Recent Labs Lab 12/20/15 0950  NA 142  K 4.1  CL 100*  CO2 37*  GLUCOSE 98  BUN 11  CREATININE 0.81  CALCIUM 9.5  AST 15  ALT 11*  ALKPHOS 99  BILITOT 0.2*   ------------------------------------------------------------------------------------------------------------------  Cardiac Enzymes  Recent  Labs Lab 12/20/15 0950  TROPONINI <0.03   ------------------------------------------------------------------------------------------------------------------  RADIOLOGY:  Ct Abdomen Pelvis W Contrast  Result Date: 12/20/2015 CLINICAL DATA:  Right lower quadrant pain for 2 days. No known injury. EXAM: CT ABDOMEN AND PELVIS WITH CONTRAST TECHNIQUE: Multidetector CT imaging of the abdomen and pelvis was performed using the standard protocol following bolus administration of intravenous contrast. CONTRAST:  100 ml ISOVUE-300 IOPAMIDOL (ISOVUE-300) INJECTION 61% COMPARISON:  None. FINDINGS: Minimal bronchiectasis and mild not micronodularity are seen in the right middle lobe likely due to prior infectious or inflammatory process. No pleural or pericardial effusion.  Heart size is normal. The gallbladder has been removed. The liver, spleen, adrenal glands and pancreas are unremarkable. Single small cysts are seen in each kidney. 0.3 cm nonobstructing stone upper pole right kidney is noted. There is aortoiliac atherosclerosis without aneurysm. IVC filter is in place. Uterus, adnexa and urinary bladder appear normal. Diverticulosis is most notable in the sigmoid colon. No diverticulitis. The appendix is well seen and appears normal. The stomach and small bowel are unremarkable. There is no lymphadenopathy or fluid. Trace anterolisthesis L3 on L4 is due to facet arthropathy. Marked loss of disc space height is seen at L4-5. No lytic or sclerotic bony lesion. IMPRESSION: No acute abnormality or finding to explain the patient's symptoms. The appendix appears normal. Atherosclerotic vascular disease. 0.3 cm nonobstructing stone upper pole right kidney. Diverticulosis without diverticulitis. Electronically Signed   By: Inge Rise M.D.   On: 12/20/2015 12:40     IMPRESSION AND PLAN:   70 year old female with past medical history of essential hypertension, COPD, gout, history of previous DVT/PE, ongoing tobacco abuse, Hyperlipidemia who presents to the hospital with vague abdominal pain and noted to be in acute on chronic respiratory failure with hypercapnia.  1. Acute on chronic respiratory failure with hypercapnia-this is secondary to underlying COPD. -Continue BiPAP support, follow serial ABGs.  2. COPD exacerbation-this is a cause of patient's acute on chronic respiratory failure. -Start patient on IV steroids, scheduled DuoNeb's, Pulmicort nebs. Follow clinically. Patient started on chronic oxygen at home.  3. Abdominal pain-etiology unclear. LFTs stable. A CT of the abdomen pelvis shows no acute pathology. -Continue supportive care for now.  4. History of previous DVT/PE-continue Xarelto.  5. Gout-no acute attack, continue allopurinol.  6.  Hyperlipidemia-continue atorvastatin.  7. Accelerated hypertension-continue lisinopril/HCTZ. I will add some as needed hydralazine.    All the records are reviewed and case discussed with ED provider. Management plans discussed with the patient, family and they are in agreement.  CODE STATUS: Full  TOTAL TIME TAKING CARE OF THIS PATIENT: 45 minutes.    Henreitta Leber M.D on 12/20/2015 at 1:51 PM  Between 7am to 6pm - Pager - [phone]  After 6pm go to [email or web address] - password [email or web address] [facility]  Office  [phone]  CC: Primary care physician; Halina Maidens, MD

## 2015-12-20 NOTE — ED Notes (Signed)
Admitting MD at bedside.

## 2015-12-21 ENCOUNTER — Inpatient Hospital Stay: Payer: Medicare Other

## 2015-12-21 DIAGNOSIS — R1031 Right lower quadrant pain: Secondary | ICD-10-CM

## 2015-12-21 DIAGNOSIS — J441 Chronic obstructive pulmonary disease with (acute) exacerbation: Secondary | ICD-10-CM

## 2015-12-21 DIAGNOSIS — J9622 Acute and chronic respiratory failure with hypercapnia: Principal | ICD-10-CM

## 2015-12-21 DIAGNOSIS — R1030 Lower abdominal pain, unspecified: Secondary | ICD-10-CM

## 2015-12-21 LAB — CBC
HEMATOCRIT: 57.6 % — AB (ref 35.0–47.0)
HEMOGLOBIN: 18.8 g/dL — AB (ref 12.0–16.0)
MCH: 28.8 pg (ref 26.0–34.0)
MCHC: 32.6 g/dL (ref 32.0–36.0)
MCV: 88.3 fL (ref 80.0–100.0)
Platelets: 164 10*3/uL (ref 150–440)
RBC: 6.53 MIL/uL — ABNORMAL HIGH (ref 3.80–5.20)
RDW: 19.1 % — AB (ref 11.5–14.5)
WBC: 4.3 10*3/uL (ref 3.6–11.0)

## 2015-12-21 LAB — BASIC METABOLIC PANEL
Anion gap: 6 (ref 5–15)
BUN: 14 mg/dL (ref 6–20)
CO2: 38 mmol/L — AB (ref 22–32)
Calcium: 9.2 mg/dL (ref 8.9–10.3)
Chloride: 97 mmol/L — ABNORMAL LOW (ref 101–111)
Creatinine, Ser: 0.86 mg/dL (ref 0.44–1.00)
GFR calc Af Amer: >60 mL/min (ref >60)
GLUCOSE: 172 mg/dL — AB (ref 65–99)
POTASSIUM: 4.2 mmol/L (ref 3.5–5.1)
Sodium: 141 mmol/L (ref 135–145)

## 2015-12-21 MED ORDER — CETYLPYRIDINIUM CHLORIDE 0.05 % MT LIQD: 7.0000 mL | Freq: Two times a day (BID) | OROMUCOSAL | Status: DC

## 2015-12-21 NOTE — Progress Notes (Signed)
Patient consistently getting out of bed. Ambulates appropriately, tubing length is the barrier. Continues to complain about abd pain, CT results neg. No further issues at this time. A&Ox4 , 4L O2. Will continue to monitor

## 2015-12-21 NOTE — Progress Notes (Signed)
Paged MD Wieting about transferring patient to the floor. MD Earleen Newport ordered for patient to go to the floor, without telemetry.  Will continue to monitor patient.

## 2015-12-21 NOTE — Progress Notes (Signed)
PT Cancellation Note  Patient Details Name: Erica Poole MRN: [id number] DOB: 15-Jun-1945   Cancelled Treatment:    Reason Eval/Treat Not Completed: Other (comment). Consult received and chart reviewed. Pt with pending hip imaging, will hold PT evaluation until cleared. Continue to follow.   Danea Manter 12/21/2015, 3:52 PM  Greggory Stallion, PT, DPT [phone]

## 2015-12-21 NOTE — Progress Notes (Deleted)
CPM applied per patient's preference of time. Began session at 50 degrees. Participated in PT as ordered with good progress. Will continue to monitor.

## 2015-12-21 NOTE — Progress Notes (Signed)
[facility] PROGRESS NOTE  Erica Poole [id number] DOB: May 26, 1945 DOA: 12/20/2015 PCP: Halina Maidens, MD  HPI/Subjective: Patient came in with abdominal pain in the right groin area. She's having difficulty walking. She's having worse pain when she moves around. She states that she's been picking up her 14 pound dog and other packages that are heavy.  Objective: Vitals:   12/21/15 1231 12/21/15 1505  BP: (!) 144/62 (!) 168/60  Pulse: (!) 56 63  Resp: (!) 22 18  Temp: 98 F (36.7 C) 98.3 F (36.8 C)    Filed Weights   12/20/15 0942 12/20/15 1700  Weight: 79.4 kg (175 lb) 73.2 kg (161 lb 6 oz)    ROS: Review of Systems  Constitutional: Negative for chills and fever.  Eyes: Negative for blurred vision.  Respiratory: Positive for cough and shortness of breath.   Cardiovascular: Negative for chest pain.  Gastrointestinal: Positive for abdominal pain. Negative for constipation, diarrhea, nausea and vomiting.  Genitourinary: Negative for dysuria.  Musculoskeletal: Positive for joint pain.  Neurological: Negative for dizziness and headaches.   Exam: Physical Exam  Constitutional: She is oriented to person, place, and time.  HENT:  Nose: No mucosal edema.  Mouth/Throat: No oropharyngeal exudate or posterior oropharyngeal edema.  Eyes: Conjunctivae, EOM and lids are normal. Pupils are equal, round, and reactive to light.  Neck: No JVD present. Carotid bruit is not present. No edema present. No thyroid mass and no thyromegaly present.  Cardiovascular: S1 normal and S2 normal.  Exam reveals no gallop.   No murmur heard. Pulses:      Dorsalis pedis pulses are 2+ on the right side, and 2+ on the left side.  Respiratory: No respiratory distress. She has no wheezes. She has no rhonchi. She has no rales.  GI: Soft. Bowel sounds are normal. There is no tenderness.  Musculoskeletal:       Right hip: She exhibits decreased range of motion and tenderness.       Right ankle:  She exhibits swelling.       Left ankle: She exhibits swelling.  Lymphadenopathy:    She has no cervical adenopathy.  Neurological: She is alert and oriented to person, place, and time. No cranial nerve deficit.  Skin: Skin is warm. No rash noted. Nails show no clubbing.  Psychiatric: She has a normal mood and affect.      Data Reviewed: Basic Metabolic Panel:  Recent Labs Lab 12/20/15 0950 12/21/15 0306  NA 142 141  K 4.1 4.2  CL 100* 97*  CO2 37* 38*  GLUCOSE 98 172*  BUN 11 14  CREATININE 0.81 0.86  CALCIUM 9.5 9.2   Liver Function Tests:  Recent Labs Lab 12/20/15 0950  AST 15  ALT 11*  ALKPHOS 99  BILITOT 0.2*  PROT 7.1  ALBUMIN 3.3*    Recent Labs Lab 12/20/15 0950  LIPASE 23   CBC:  Recent Labs Lab 12/20/15 0950 12/21/15 0306  WBC 7.7 4.3  HGB 20.4* 18.8*  HCT 61.6* 57.6*  MCV 86.9 88.3  PLT 174 164   Cardiac Enzymes:  Recent Labs Lab 12/20/15 0950  TROPONINI <0.03    CBG:  Recent Labs Lab 12/20/15 1632  GLUCAP 90    Recent Results (from the past 240 hour(s))  MRSA PCR Screening     Status: None   Collection Time: 12/20/15  4:44 PM  Result Value Ref Range Status   MRSA by PCR NEGATIVE NEGATIVE Final    Comment:  The GeneXpert MRSA Assay (FDA approved for NASAL specimens only), is one component of a comprehensive MRSA colonization surveillance program. It is not intended to diagnose MRSA infection nor to guide or monitor treatment for MRSA infections.      Studies: Ct Abdomen Pelvis W Contrast  Result Date: 12/20/2015 CLINICAL DATA:  Right lower quadrant pain for 2 days. No known injury. EXAM: CT ABDOMEN AND PELVIS WITH CONTRAST TECHNIQUE: Multidetector CT imaging of the abdomen and pelvis was performed using the standard protocol following bolus administration of intravenous contrast. CONTRAST:  100 ml ISOVUE-300 IOPAMIDOL (ISOVUE-300) INJECTION 61% COMPARISON:  None. FINDINGS: Minimal bronchiectasis and mild not  micronodularity are seen in the right middle lobe likely due to prior infectious or inflammatory process. No pleural or pericardial effusion. Heart size is normal. The gallbladder has been removed. The liver, spleen, adrenal glands and pancreas are unremarkable. Single small cysts are seen in each kidney. 0.3 cm nonobstructing stone upper pole right kidney is noted. There is aortoiliac atherosclerosis without aneurysm. IVC filter is in place. Uterus, adnexa and urinary bladder appear normal. Diverticulosis is most notable in the sigmoid colon. No diverticulitis. The appendix is well seen and appears normal. The stomach and small bowel are unremarkable. There is no lymphadenopathy or fluid. Trace anterolisthesis L3 on L4 is due to facet arthropathy. Marked loss of disc space height is seen at L4-5. No lytic or sclerotic bony lesion. IMPRESSION: No acute abnormality or finding to explain the patient's symptoms. The appendix appears normal. Atherosclerotic vascular disease. 0.3 cm nonobstructing stone upper pole right kidney. Diverticulosis without diverticulitis. Electronically Signed   By: Inge Rise M.D.   On: 12/20/2015 12:40   Dg Hip Unilat With Pelvis 2-3 Views Right  Result Date: 12/21/2015 CLINICAL DATA:  Right hip pain for 3 days. No known injury. Initial encounter. EXAM: DG HIP (WITH OR WITHOUT PELVIS) 2-3V RIGHT COMPARISON:  None. FINDINGS: There is no evidence of hip fracture or dislocation. There is no evidence of arthropathy or other focal bone abnormality. 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 IMPRESSION: No acute abnormality. Electronically Signed   By: Inge Rise M.D.   On: 12/21/2015 15:44    Scheduled Meds: . allopurinol  100 mg Oral Daily  . antiseptic oral rinse  7 mL Mouth Rinse q12n4p  . atorvastatin  20 mg Oral Daily  . budesonide (PULMICORT) nebulizer solution  0.25 mg Nebulization BID  . chlorhexidine  15 mL Mouth Rinse BID  . furosemide  20  mg Oral Daily  . lisinopril  10 mg Oral Daily   And  . hydrochlorothiazide  12.5 mg Oral Daily  . ipratropium-albuterol  3 mL Nebulization Q6H  . magnesium oxide  400 mg Oral Daily  . meclizine  12.5 mg Oral BID  . methylPREDNISolone (SOLU-MEDROL) injection  40 mg Intravenous Q6H  . potassium chloride  10 mEq Oral Daily  . rivaroxaban  20 mg Oral Q supper    Assessment/Plan:  1. Acute on chronic respiratory failure with hypercarbia and hypoxia. Patient required BiPAP on presentation to the hospital. Patient is breathing better and is now on nasal cannula 4 L. I decreased the nasal cannula down to her baseline 3 L of oxygen. 2. COPD exacerbation. Continue Solu-Medrol and DuoNeb and Pulmicort nebulizers. 3. Right groin pain. CT scan of the abdomen and pelvis negative. I ordered x-rays of the pelvis and right hip which were also negative. Not quite sure if there is a hernia there or  if this is musculoskeletal pain. Asked general surgery to evaluate for possible hernia. Patient will need to walk prior to disposition 4. History of PE on Xarelto 5. Hyperlipidemia unspecified on atorvastatin 6. Essential hypertension continue lisinopril HCT 7. History of gout on allopurinol 8. Polycythemia with a hemoglobin of 18.8  Code Status:     Code Status Orders        Start     Ordered   12/20/15 1631  Full code  Continuous     12/20/15 1630    Code Status History    Date Active Date Inactive Code Status Order ID Comments User Context   This patient has a current code status but no historical code status.     Disposition Plan: Potential home tomorrow  Consultants:  General surgery  Time spent: 25 minutes  [place], [place]

## 2015-12-21 NOTE — Consult Note (Signed)
Surgical Consultation  12/21/2015  Erica Poole is an 70 y.o. female.   CC: Right lower quadrant abdominal pain  HPI: This patient with 2 days of abdominal pain that started in the right lower quadrant I was asked see the patient for the potential for a femoral hernia. She was admitted the hospital yesterday with a CT scan showing no sign of intra-abdominal pathology and that her CT scan has been personally reviewed showing no sign of a femoral hernia where appendicitis.  She describes ongoing abdominal pain with no nausea or vomiting she is somewhat tearful and points to the right lower quadrant near McBurney's point not in the femoral area  She has never had an episode like this before he has had a hysterectomy His multiple medical problems see below she is anticoagulated  Past Medical History:  Diagnosis Date  . Bilateral pulmonary embolism ([place])    a. 04/24/2012 -->Started on Xarelto;  b. 04/28/2012 s/p IVC Filter.  . Chronic diastolic CHF (congestive heart failure) ([place])    a. 04/2012 Echo: EF 55%, mildly dil RV w/ mod reduced RV fxn, mild bi-atrial enlargement.  Marland Kitchen COPD (chronic obstructive pulmonary disease) ([place])   . Fibrocystic breast disease   . Gout   . Hypertension   . Left leg DVT ([place])    a. 04/24/2012 -->Started on Xarelto  . Personal history of tobacco use, presenting hazards to health 01/23/2015  . Polycythemia   . Tobacco abuse    a. quit 04/2012    Past Surgical History:  Procedure Laterality Date  . CHOLECYSTECTOMY    . TUBAL LIGATION    . Vocal cord polyp excision      Family History  Problem Relation Age of Onset  . Heart disease Mother   . Heart attack Mother   . Stroke Father   . Heart attack Sister   . Lung cancer Sister     sister was a smoker  . Hypertension Brother     Social History:  reports that she has been smoking Cigarettes.  She has a 50.00 pack-year smoking history. She has never used smokeless tobacco. She reports that she drinks  about 1.2 oz of alcohol per week . She reports that she does not use drugs.  Allergies: No Known Allergies  Medications reviewed.   Review of Systems:   Review of Systems  Constitutional: Negative for chills and fever.  HENT: Negative.   Eyes: Negative.   Respiratory: Positive for cough, shortness of breath and wheezing.   Cardiovascular: Negative for chest pain and palpitations.  Gastrointestinal: Positive for abdominal pain and blood in stool. Negative for heartburn, nausea and vomiting.  Genitourinary: Negative.   Musculoskeletal: Negative.   Skin: Negative.   Neurological: Negative.   Endo/Heme/Allergies: Negative.   Psychiatric/Behavioral: Negative.      Physical Exam:  BP (!) 168/60 (BP Location: Right Arm)   Pulse 63   Temp 98.3 F (36.8 C) (Oral)   Resp 18   Ht 4' 11"  (1.499 m)   Wt 161 lb 6 oz (73.2 kg)   SpO2 93%   BMI 32.59 kg/m   Physical Exam  Constitutional: She is oriented to person, place, and time. She appears distressed.  Uncomfortable-appearing female patient prefers to sit up slightly and her pain is positional when she laid rolls to the left  HENT:  Head: Normocephalic and atraumatic.  Eyes: Pupils are equal, round, and reactive to light. Right eye exhibits no discharge. Left eye exhibits no discharge. No  scleral icterus.  Neck: Normal range of motion.  Cardiovascular: Normal rate, regular rhythm and normal heart sounds.   Pulmonary/Chest: Effort normal. No respiratory distress. She has wheezes. She has no rales.  Abdominal: Soft. She exhibits no distension. There is tenderness. There is no rebound and no guarding.  Axonal tenderness in the right quadrants there is no tenderness in the groin and no mass to suggest femoral hernia  Musculoskeletal: Normal range of motion. She exhibits edema. She exhibits no tenderness or deformity.  Lymphadenopathy:    She has no cervical adenopathy.  Neurological: She is alert and oriented to person, place, and  time.  Skin: Skin is warm and dry. She is not diaphoretic. No erythema.  Psychiatric: Mood and affect normal.  Vitals reviewed.     Results for orders placed or performed during the hospital encounter of 12/20/15 (from the past 48 hour(s))  Lipase, blood     Status: None   Collection Time: 12/20/15  9:50 AM  Result Value Ref Range   Lipase 23 11 - 51 U/L  Comprehensive metabolic panel     Status: Abnormal   Collection Time: 12/20/15  9:50 AM  Result Value Ref Range   Sodium 142 135 - 145 mmol/L   Potassium 4.1 3.5 - 5.1 mmol/L   Chloride 100 (L) 101 - 111 mmol/L   CO2 37 (H) 22 - 32 mmol/L   Glucose, Bld 98 65 - 99 mg/dL   BUN 11 6 - 20 mg/dL   Creatinine, Ser 0.81 0.44 - 1.00 mg/dL   Calcium 9.5 8.9 - 10.3 mg/dL   Total Protein 7.1 6.5 - 8.1 g/dL   Albumin 3.3 (L) 3.5 - 5.0 g/dL   AST 15 15 - 41 U/L   ALT 11 (L) 14 - 54 U/L   Alkaline Phosphatase 99 38 - 126 U/L   Total Bilirubin 0.2 (L) 0.3 - 1.2 mg/dL   GFR calc non Af Amer >60 >60 mL/min   GFR calc Af Amer >60 >60 mL/min    Comment: (NOTE) The eGFR has been calculated using the CKD EPI equation. This calculation has not been validated in all clinical situations. eGFR's persistently <60 mL/min signify possible Chronic Kidney Disease.    Anion gap 5 5 - 15  CBC     Status: Abnormal   Collection Time: 12/20/15  9:50 AM  Result Value Ref Range   WBC 7.7 3.6 - 11.0 K/uL   RBC 7.09 (H) 3.80 - 5.20 MIL/uL   Hemoglobin 20.4 (H) 12.0 - 16.0 g/dL   HCT 61.6 (H) 35.0 - 47.0 %   MCV 86.9 80.0 - 100.0 fL   MCH 28.7 26.0 - 34.0 pg   MCHC 33.1 32.0 - 36.0 g/dL   RDW 19.1 (H) 11.5 - 14.5 %   Platelets 174 150 - 440 K/uL  Urinalysis complete, with microscopic     Status: Abnormal   Collection Time: 12/20/15  9:50 AM  Result Value Ref Range   Color, Urine YELLOW (A) YELLOW   APPearance CLEAR (A) CLEAR   Glucose, UA NEGATIVE NEGATIVE mg/dL   Bilirubin Urine NEGATIVE NEGATIVE   Ketones, ur NEGATIVE NEGATIVE mg/dL   Specific  Gravity, Urine 1.017 1.005 - 1.030   Hgb urine dipstick NEGATIVE NEGATIVE   pH 5.0 5.0 - 8.0   Protein, ur >500 (A) NEGATIVE mg/dL   Nitrite NEGATIVE NEGATIVE   Leukocytes, UA NEGATIVE NEGATIVE   RBC / HPF 0-5 0 - 5 RBC/hpf   WBC, UA  0-5 0 - 5 WBC/hpf   Bacteria, UA NONE SEEN NONE SEEN   Squamous Epithelial / LPF 0-5 (A) NONE SEEN   Mucous PRESENT    Hyaline Casts, UA PRESENT   Urine Drug Screen, Qualitative ([facility] only)     Status: None   Collection Time: 12/20/15  9:50 AM  Result Value Ref Range   Tricyclic, Ur Screen NONE DETECTED NONE DETECTED   Amphetamines, Ur Screen NONE DETECTED NONE DETECTED   MDMA (Ecstasy)Ur Screen NONE DETECTED NONE DETECTED   Cocaine Metabolite,Ur [place] NONE DETECTED NONE DETECTED   Opiate, Ur Screen NONE DETECTED NONE DETECTED   Phencyclidine (PCP) Ur S NONE DETECTED NONE DETECTED   Cannabinoid 50 Ng, Ur  NONE DETECTED NONE DETECTED   Barbiturates, Ur Screen NONE DETECTED NONE DETECTED   Benzodiazepine, Ur Scrn NONE DETECTED NONE DETECTED   Methadone Scn, Ur NONE DETECTED NONE DETECTED    Comment: (NOTE) 633  Tricyclics, urine               Cutoff 1000 ng/mL 200  Amphetamines, urine             Cutoff 1000 ng/mL 300  MDMA (Ecstasy), urine           Cutoff 500 ng/mL 400  Cocaine Metabolite, urine       Cutoff 300 ng/mL 500  Opiate, urine                   Cutoff 300 ng/mL 600  Phencyclidine (PCP), urine      Cutoff 25 ng/mL 700  Cannabinoid, urine              Cutoff 50 ng/mL 800  Barbiturates, urine             Cutoff 200 ng/mL 900  Benzodiazepine, urine           Cutoff 200 ng/mL 1000 Methadone, urine                Cutoff 300 ng/mL 1100 1200 The urine drug screen provides only a preliminary, unconfirmed 1300 analytical test result and should not be used for non-medical 1400 purposes. Clinical consideration and professional judgment should 1500 be applied to any positive drug screen result due to possible 1600 interfering substances. A more  specific alternate chemical method 1700 must be used in order to obtain a confirmed analytical result.  1800 Gas chromato graphy / mass spectrometry (GC/MS) is the preferred 1900 confirmatory method.   Troponin I     Status: None   Collection Time: 12/20/15  9:50 AM  Result Value Ref Range   Troponin I <0.03 <0.03 ng/mL  Blood gas, venous     Status: Abnormal   Collection Time: 12/20/15 12:12 PM  Result Value Ref Range   pH, Ven 7.26 (L) 7.320 - 7.430   pCO2, Ven 90 (HH) 44.0 - 60.0 mmHg    Comment: CRITICAL RESULT CALLED TO, READ BACK BY AND VERIFIED WITH: RESULTS CALLED TO DR.WILLIAMS, JONATHAN AT 1218 ON 12/20/2015 BY GKM    pO2, Ven <31.0 (L) 31.0 - 45.0 mmHg    Comment: VENOUS   Bicarbonate 40.4 (H) 21.0 - 28.0 mEq/L   Acid-Base Excess 7.5 (H) 0.0 - 3.0 mmol/L   Patient temperature 37.0    Collection site VEIN    Sample type VENOUS   Glucose, capillary     Status: None   Collection Time: 12/20/15  4:32 PM  Result Value Ref Range   Glucose-Capillary 90  95 - 99 mg/dL  MRSA PCR Screening     Status: None   Collection Time: 12/20/15  4:44 PM  Result Value Ref Range   MRSA by PCR NEGATIVE NEGATIVE    Comment:        The GeneXpert MRSA Assay (FDA approved for NASAL specimens only), is one component of a comprehensive MRSA colonization surveillance program. It is not intended to diagnose MRSA infection nor to guide or monitor treatment for MRSA infections.   Basic metabolic panel     Status: Abnormal   Collection Time: 12/21/15  3:06 AM  Result Value Ref Range   Sodium 141 135 - 145 mmol/L   Potassium 4.2 3.5 - 5.1 mmol/L   Chloride 97 (L) 101 - 111 mmol/L   CO2 38 (H) 22 - 32 mmol/L   Glucose, Bld 172 (H) 65 - 99 mg/dL   BUN 14 6 - 20 mg/dL   Creatinine, Ser 0.86 0.44 - 1.00 mg/dL   Calcium 9.2 8.9 - 10.3 mg/dL   GFR calc non Af Amer >60 >60 mL/min   GFR calc Af Amer >60 >60 mL/min    Comment: (NOTE) The eGFR has been calculated using the CKD EPI equation. This  calculation has not been validated in all clinical situations. eGFR's persistently <60 mL/min signify possible Chronic Kidney Disease.    Anion gap 6 5 - 15  CBC     Status: Abnormal   Collection Time: 12/21/15  3:06 AM  Result Value Ref Range   WBC 4.3 3.6 - 11.0 K/uL   RBC 6.53 (H) 3.80 - 5.20 MIL/uL   Hemoglobin 18.8 (H) 12.0 - 16.0 g/dL   HCT 57.6 (H) 35.0 - 47.0 %   MCV 88.3 80.0 - 100.0 fL   MCH 28.8 26.0 - 34.0 pg   MCHC 32.6 32.0 - 36.0 g/dL   RDW 19.1 (H) 11.5 - 14.5 %   Platelets 164 150 - 440 K/uL   Ct Abdomen Pelvis W Contrast  Result Date: 12/20/2015 CLINICAL DATA:  Right lower quadrant pain for 2 days. No known injury. EXAM: CT ABDOMEN AND PELVIS WITH CONTRAST TECHNIQUE: Multidetector CT imaging of the abdomen and pelvis was performed using the standard protocol following bolus administration of intravenous contrast. CONTRAST:  100 ml ISOVUE-300 IOPAMIDOL (ISOVUE-300) INJECTION 61% COMPARISON:  None. FINDINGS: Minimal bronchiectasis and mild not micronodularity are seen in the right middle lobe likely due to prior infectious or inflammatory process. No pleural or pericardial effusion. Heart size is normal. The gallbladder has been removed. The liver, spleen, adrenal glands and pancreas are unremarkable. Single small cysts are seen in each kidney. 0.3 cm nonobstructing stone upper pole right kidney is noted. There is aortoiliac atherosclerosis without aneurysm. IVC filter is in place. Uterus, adnexa and urinary bladder appear normal. Diverticulosis is most notable in the sigmoid colon. No diverticulitis. The appendix is well seen and appears normal. The stomach and small bowel are unremarkable. There is no lymphadenopathy or fluid. Trace anterolisthesis L3 on L4 is due to facet arthropathy. Marked loss of disc space height is seen at L4-5. No lytic or sclerotic bony lesion. IMPRESSION: No acute abnormality or finding to explain the patient's symptoms. The appendix appears normal.  Atherosclerotic vascular disease. 0.3 cm nonobstructing stone upper pole right kidney. Diverticulosis without diverticulitis. Electronically Signed   By: Inge Rise M.D.   On: 12/20/2015 12:40   Dg Hip Unilat With Pelvis 2-3 Views Right  Result Date: 12/21/2015 CLINICAL DATA:  Right hip  pain for 3 days. No known injury. Initial encounter. EXAM: DG HIP (WITH OR WITHOUT PELVIS) 2-3V RIGHT COMPARISON:  None. FINDINGS: There is no evidence of hip fracture or dislocation. There is no evidence of arthropathy or other focal bone abnormality. 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 IMPRESSION: No acute abnormality. Electronically Signed   By: Inge Rise M.D.   On: 12/21/2015 15:44    Assessment/Plan:  CT scan and labs are been personally reviewed. I see no sign of a femoral hernia in this patient and her pain and tenderness is somewhat cephalad to that in fact is above the inguinal ligament in the right lower quadrant and right upper quadrant maximal at McBurney's point. Her CT scan is been personally reviewed and I see no sign of this being appendicitis either. At this point I do not see an etiology for the patient's pain but should this persist or worsen then laparoscopy may be indicated I will reexamine the patient later.  Florene Glen, MD, FACS

## 2015-12-21 NOTE — Care Management Note (Addendum)
Case Management Note  Patient Details  Name: RILIE GLANZ MRN: [id number] Date of Birth: 11/10/1945  Subjective/Objective:                  Met with patient to discuss discharge planning. She states she is from home with her boyfriend. She depends on him for transportation. Her PCP is Dr Halina Maidens. She states she is currently open to home health services through ([facility] thinks) [facility]. She states she is provided chronic O2 at home but cannot recall name of agency. She states she is independent with mobility usually- no ambulatory DME available at home. She denies problems obtaining Rx or medical treatment.   Action/Plan: Notified Jason with [facility] to check status of this patient and services provided. RNCM will continue to follow. Corene Cornea with [facility] contacted this RNCM back and states that patient had been followed by their agency but not any more. I have left message for patient's boyfriend Rogers Blocker [phone] to call this RNCM back with agency name and O2 supplier.   Expected Discharge Date:                  Expected Discharge Plan:     In-House Referral:     Discharge planning Services  CM Consult  Post Acute Care Choice:  Home Health Choice offered to:  Patient  DME Arranged:    DME Agency:     HH Arranged:    [place] Agency:  [facility]  Status of Service:  In process, will continue to follow  If discussed at Long Length of Stay Meetings, dates discussed:    Additional Comments:  Marshell Garfinkel, RN 12/21/2015, 9:51 AM

## 2015-12-22 MED ORDER — PREDNISONE 5 MG PO TABS: ORAL_TABLET | ORAL | 0 refills | Status: DC

## 2015-12-22 MED ORDER — PREDNISONE 10 MG PO TABS: 5.0000 mg | ORAL_TABLET | Freq: Every day | ORAL | Status: DC

## 2015-12-22 MED ORDER — LEVOFLOXACIN 750 MG PO TABS: 750.0000 mg | ORAL_TABLET | Freq: Every day | ORAL | 0 refills | Status: DC

## 2015-12-22 NOTE — Progress Notes (Signed)
CC: Right lower quadrant pain Subjective: Patient states she is much improved today she has much less pain no nausea vomiting no groin pain and points to the right lower quadrant but states it is much better no fevers or chills  Objective: Vital signs in last 24 hours: Temp:  [97.8 F (36.6 C)-98.4 F (36.9 C)] 98.2 F (36.8 C) (08/11 0758) Pulse Rate:  [53-88] 60 (08/11 0758) Resp:  [16-22] 18 (08/11 0758) BP: (132-168)/(60-67) 132/65 (08/11 0758) SpO2:  [91 %-94 %] 94 % (08/11 0857) Last BM Date: 12/20/15  Intake/Output from previous day: 08/10 0701 - 08/11 0700 In: 720 [P.O.:720] Out: 150 [Urine:150] Intake/Output this shift: No intake/output data recorded.  Physical exam:  Vital signs reviewed On oxygen by nasal cannula no acute distress Wheezes bilaterally Abdomen soft nondistended and essentially nontender at this point no tenderness in the right groin no rebound no guarding no percussion tenderness Nontender calves with mild edema  Lab Results: CBC   Recent Labs  12/20/15 0950 12/21/15 0306  WBC 7.7 4.3  HGB 20.4* 18.8*  HCT 61.6* 57.6*  PLT 174 164   BMET  Recent Labs  12/20/15 0950 12/21/15 0306  NA 142 141  K 4.1 4.2  CL 100* 97*  CO2 37* 38*  GLUCOSE 98 172*  BUN 11 14  CREATININE 0.81 0.86  CALCIUM 9.5 9.2   PT/INR No results for input(s): LABPROT, INR in the last 72 hours. ABG  Recent Labs  12/20/15 1212  HCO3 40.4*    Studies/Results: Ct Abdomen Pelvis W Contrast  Result Date: 12/20/2015 CLINICAL DATA:  Right lower quadrant pain for 2 days. No known injury. EXAM: CT ABDOMEN AND PELVIS WITH CONTRAST TECHNIQUE: Multidetector CT imaging of the abdomen and pelvis was performed using the standard protocol following bolus administration of intravenous contrast. CONTRAST:  100 ml ISOVUE-300 IOPAMIDOL (ISOVUE-300) INJECTION 61% COMPARISON:  None. FINDINGS: Minimal bronchiectasis and mild not micronodularity are seen in the right middle lobe  likely due to prior infectious or inflammatory process. No pleural or pericardial effusion. Heart size is normal. The gallbladder has been removed. The liver, spleen, adrenal glands and pancreas are unremarkable. Single small cysts are seen in each kidney. 0.3 cm nonobstructing stone upper pole right kidney is noted. There is aortoiliac atherosclerosis without aneurysm. IVC filter is in place. Uterus, adnexa and urinary bladder appear normal. Diverticulosis is most notable in the sigmoid colon. No diverticulitis. The appendix is well seen and appears normal. The stomach and small bowel are unremarkable. There is no lymphadenopathy or fluid. Trace anterolisthesis L3 on L4 is due to facet arthropathy. Marked loss of disc space height is seen at L4-5. No lytic or sclerotic bony lesion. IMPRESSION: No acute abnormality or finding to explain the patient's symptoms. The appendix appears normal. Atherosclerotic vascular disease. 0.3 cm nonobstructing stone upper pole right kidney. Diverticulosis without diverticulitis. Electronically Signed   By: Inge Rise M.D.   On: 12/20/2015 12:40   Dg Hip Unilat With Pelvis 2-3 Views Right  Result Date: 12/21/2015 CLINICAL DATA:  Right hip pain for 3 days. No known injury. Initial encounter. EXAM: DG HIP (WITH OR WITHOUT PELVIS) 2-3V RIGHT COMPARISON:  None. FINDINGS: There is no evidence of hip fracture or dislocation. There is no evidence of arthropathy or other focal bone abnormality. 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 IMPRESSION: No acute abnormality. Electronically Signed   By: Inge Rise M.D.   On: 12/21/2015 15:44    Anti-infectives: Anti-infectives  Start     Dose/Rate Route Frequency Ordered Stop   12/22/15 0000  levofloxacin (LEVAQUIN) 750 MG tablet     750 mg Oral Daily 12/22/15 [id number]        Assessment/Plan:  Improving right sided pain that is believed to be musculoskeletal at this point I see no need for surgical  intervention. I discussed with Dr. Bobbye Charleston the patient's condition and he plans on discharging her today to follow-up when necessary  Florene Glen, MD, FACS  12/22/2015

## 2015-12-22 NOTE — Care Management Note (Signed)
Case Management Note  Patient Details  Name: Erica Poole MRN: [id number] Date of Birth: [birth year]  Subjective/Objective:       Spoke with patient again today for continue assessment. Patient stated that she has home O2 with [facility] and that she has no DME at home. She stated that there is a problem with her O2 tanks and she cannot fil them. She stated she would need at portable tank to go home on today. Contacted Jason at [facility] who will look into her O2 issues.  Patient did work with PT this morning and  The recommendation is for [facility]. Rolling walker ordered through [facility] to be delivered prior to discharge.  Patient was not open to [facility] as previously thought. Patient is from [facility] and would like a provider that is close by. [facility] is able to accommodate patient needs. Referral placed with Tre[facility] Mall @ [facility].           Action/Plan: Discharge to home today with [facility] provided by [facility] and O2 provided by [facility] who will send rep to patient home and bring portable tank to room for discharge. Will sign off.   Expected Discharge Date:                  Expected Discharge Plan:  [place]  In-House Referral:     Discharge planning Services  CM Consult  Post Acute Care Choice:  Home Health Choice offered to:  Patient  DME Arranged:    DME Agency:     HH Arranged:    HH Agency:  [facility]  Status of Service:  Completed, signed off  If discussed at [place] of Stay Meetings, dates discussed:    Additional Comments:  Alvie Heidelberg, RN 12/22/2015, 10:29 AM

## 2015-12-22 NOTE — Care Management Important Message (Signed)
Important Message  Patient Details  Name: Erica Poole MRN: [id number] Date of Birth: 05-12-1946   Medicare Important Message Given:  Yes    Alvie Heidelberg, RN 12/22/2015, 10:02 AM

## 2015-12-22 NOTE — Progress Notes (Signed)
Patient is being discharged to home. Husband bedside to take her home. IV removed, d/c and rx instructions given and pt acknowleged understanding.

## 2015-12-22 NOTE — Evaluation (Signed)
Physical Therapy Treatment Patient Details Name: Erica Poole MRN: [id number] DOB: [birth year] Today's Date: 12/22/2015    History of Present Illness pt is a 70 y/o female admitted with acute on chronic respiratory failure and reports of a 2 day Hx of ambominal and groin Px of unknown origin. She is being treated for COPD, HLD, and HTN with a Hx of CHF, gout, polycythemia vera, smoking, PE, and DVT.    PT Comments    Erica Poole is a pleasant 70 y/o female who presents with general weakness and SOB. She is independent with bed mobility, requires RW for safe transfers, and requires CGA with RW. Her strength is generally 4/5 UE and 4-/5 LE. She is on 3L O2 at home, but reports she does not always use it, and has very poor sense of when her O2 saturation decreases; reports her legs just give out sometimes and she does not know why. She ambulated 160 with RW, and 88' without RW; without RW she had wide BOS and stumbled multiple times requiring close guard with min assist to prevent falls. She experienced O2 desaturation to 85% on 3L and required 1 minute to get back to 90% with cuing for breathing technique.   Follow Up Recommendations  Home health PT     Equipment Recommendations  Rolling walker with 5" wheels    Recommendations for Other Services       Precautions / Restrictions Precautions Precautions: Fall Precaution Comments: high fall risk, use RW at all times    Mobility  Bed Mobility Overal bed mobility: Independent Bed Mobility: Supine to Sit     Supine to sit: Independent Sit to supine: Independent   General bed mobility comments: Pt takes extended time and increased effort, but is independent  Transfers Overall transfer level: Independent Equipment used: Rolling walker (2 wheeled)                Ambulation/Gait Ambulation/Gait assistance: Min assist;Min guard Ambulation Distance (Feet): 240 Feet Assistive device: Rolling walker (2 wheeled);None Gait  Pattern/deviations: Step-through pattern;Wide base of support;Decreased stride length;Staggering right;Staggering left Gait velocity: 10' in 6-7 seconds   General Gait Details: Pt ambulated with and without RW; W/o RW she staggers L and R frequently and requires close guard to avoid falls.    Stairs            Wheelchair Mobility    Modified Rankin (Stroke Patients Only)       Balance Overall balance assessment: Needs assistance Sitting-balance support: Feet supported Sitting balance-Leahy Scale: Good Sitting balance - Comments: WFL   Standing balance support: No upper extremity supported Standing balance-Leahy Scale: Poor Standing balance comment: Pt needs UE support to keep from staggering; she cannot tolerate challenge. Much more steady with RW                    Cognition Arousal/Alertness: Awake/alert Behavior During Therapy: WFL for tasks assessed/performed Overall Cognitive Status: Within Functional Limits for tasks assessed                      Exercises Other Exercises Other Exercises: Gait training with RW including safe technique, hand placement, posture, and gait mechanics X 160'. Pt ambulates with significantly reduced fall risk with RW. Pt had O2 sesat to 85% on 3L O2 and required 1 minute standing break with cues for breathing technique to get back to 90%.    General Comments        Pertinent  Vitals/Pain Pain Assessment: Faces Faces Pain Scale: Hurts a little bit Pain Location: R groin/ abdomen  Pain Descriptors / Indicators: Discomfort Pain Intervention(s): Limited activity within patient's tolerance;Monitored during session    [place] expects to be discharged to:: Private residence Living Arrangements: Spouse/significant other Available Help at Discharge:  (pt did not specify) Type of Home: Mobile home Home Access: Stairs to enter Entrance Stairs-Rails: Can reach both Home Layout: One level Home Equipment:  Wheelchair - manual      Prior Function Level of Independence: Independent      Comments: Hydrographic surveyor, poor safety awareness. Does not maintain O2 sats well, reports she has llittle sense of when her O2 sats get low, and that sometimes her legs give out for unknown reasons   PT Goals (current goals can now be found in the care plan section) Acute Rehab PT Goals Patient Stated Goal: go home PT Goal Formulation: With patient Time For Goal Achievement: 01/05/16 Potential to Achieve Goals: Good    Frequency  Min 2X/week    PT Plan      Co-evaluation             End of Session Equipment Utilized During Treatment: Gait belt Activity Tolerance: Patient tolerated treatment well Patient left: in chair;with call bell/phone within reach;with nursing/sitter in room     Time: 0855-0916 PT Time Calculation (min) (ACUTE ONLY): 21 min  Charges:  $Gait Training: 8-22 mins                    G Codes:      Erica Poole 01/10/16, 2:01 PM  Erica Poole, SPT [phone]

## 2015-12-30 NOTE — Discharge Summary (Signed)
[facility] [place] NAME: Erica Poole    MR#:  [id number]  DATE OF BIRTH:  09-02-1945  DATE OF ADMISSION:  12/20/2015 ADMITTING PHYSICIAN: Henreitta Leber, MD  DATE OF DISCHARGE: 12/22/2015 12:09 PM  PRIMARY CARE PHYSICIAN: Halina Maidens, MD    ADMISSION DIAGNOSIS:  Polycythemia [D75.1] Right lower quadrant abdominal pain [R10.31] Chronic obstructive pulmonary disease with acute exacerbation (HCC) [J44.1] Acute respiratory failure with hypoxia and hypercarbia (HCC) [J96.01, J96.02]  DISCHARGE DIAGNOSIS:  Active Problems:   Acute on chronic respiratory failure with hypercapnia (HCC)   Right groin pain   Right lower quadrant abdominal pain   SECONDARY DIAGNOSIS:   Past Medical History:  Diagnosis Date  . Bilateral pulmonary embolism ([place])    a. 04/24/2012 -->Started on Xarelto;  b. 04/28/2012 s/p IVC Filter.  . Chronic diastolic CHF (congestive heart failure) ([place])    a. 04/2012 Echo: EF 55%, mildly dil RV w/ mod reduced RV fxn, mild bi-atrial enlargement.  Marland Kitchen COPD (chronic obstructive pulmonary disease) ([place])   . Fibrocystic breast disease   . Gout   . Hypertension   . Left leg DVT ([place])    a. 04/24/2012 -->Started on Xarelto  . Personal history of tobacco use, presenting hazards to health 01/23/2015  . Polycythemia   . Tobacco abuse    a. quit 04/2012    HOSPITAL COURSE:   1. Acute on chronic respiratory failure with hypercarbia and hypoxia. Patient initially required BiPAP on presentation. Patient breathing much better and on her baseline nasal cannula. 2. COPD exacerbation patient was switched from IV Solu-Medrol over to prednisone taper. Continue her usual inhalers at home. Respiratory status back to her baseline. 3. Right groin pain. CT scan of the abdomen and pelvis was negative. X-ray of the pelvis and right hip were also negative. Gen. surgery evaluated and they did not believe that this was a hernia or appendicitis.  Patient feeling better with steroids. Walked well with physical therapy. 4. History of PE on Xarelto 5. Hyperlipidemia unspecified on atorvastatin 6. Essential hypertension on lisinopril HCT 7. History of gout on allopurinol 8. Polycythemia with a hemoglobin of 18.8. Can consider phlebotomies as outpatient if still elevated.  DISCHARGE CONDITIONS:   Satisfactory  CONSULTS OBTAINED:   general surgery  DRUG ALLERGIES:  No Known Allergies  DISCHARGE MEDICATIONS:   Discharge Medication List as of 12/22/2015 11:26 AM    START taking these medications   Details  levofloxacin (LEVAQUIN) 750 MG tablet Take 1 tablet (750 mg total) by mouth daily., Starting Fri 12/22/2015, Print    predniSONE (DELTASONE) 5 MG tablet 5 tabs po day1; 4 tabs po day2; 3 tabs po day3; 2 tabs po day4; 1 tab po day5,6, Print      CONTINUE these medications which have NOT CHANGED   Details  albuterol (PROVENTIL HFA;VENTOLIN HFA) 108 (90 BASE) MCG/ACT inhaler Inhale 2 puffs into the lungs every 6 (six) hours as needed for wheezing or shortness of breath., Starting Fri 03/10/2015, Normal    allopurinol (ZYLOPRIM) 100 MG tablet Take 1 tablet by mouth daily. , Historical Med    atorvastatin (LIPITOR) 20 MG tablet Take 20 mg by mouth daily., Historical Med    budesonide-formoterol (SYMBICORT) 160-4.5 MCG/ACT inhaler Inhale 2 puffs into the lungs 2 (two) times daily., Starting Fri 09/08/2015, Normal    furosemide (LASIX) 20 MG tablet Take 20 mg by mouth daily. , Historical Med    ipratropium-albuterol (DUONEB) 0.5-2.5 (3) MG/3ML SOLN  Take 3 mLs by nebulization every 6 (six) hours as needed., Starting Fri 09/08/2015, Normal    lisinopril-hydrochlorothiazide (PRINZIDE,ZESTORETIC) 10-12.5 MG per tablet Take 1 tablet by mouth daily., Starting Fri 01/20/2015, Historical Med    magnesium oxide (MAG-OX) 400 MG tablet Take 1 tablet (400 mg total) by mouth daily. Recommend mag oxide brand-name his elbow over-the-counter yet,  Starting Fri 01/20/2015, Print    meclizine (ANTIVERT) 12.5 MG tablet Take 1 tablet (12.5 mg total) by mouth 2 (two) times daily., Starting Fri 09/08/2015, Normal    potassium chloride (K-DUR) 10 MEQ tablet Take 10 mEq by mouth daily., Historical Med    rivaroxaban (XARELTO) 20 MG TABS tablet Take 20 mg by mouth daily with supper., Historical Med    Misc Natural Products (BLACK CHERRY CONCENTRATE) LIQD Take 15 mLs by mouth 1 day or 1 dose. For gout, Historical Med    OXYGEN Inhale 3 L/day into the lungs., Historical Med         DISCHARGE INSTRUCTIONS:   Follow-up PMD one week  If you experience worsening of your admission symptoms, develop shortness of breath, life threatening emergency, suicidal or homicidal thoughts you must seek medical attention immediately by calling 911 or calling your MD immediately  if symptoms less severe.  You Must read complete instructions/literature along with all the possible adverse reactions/side effects for all the Medicines you take and that have been prescribed to you. Take any new Medicines after you have completely understood and accept all the possible adverse reactions/side effects.   Please note  You were cared for by a hospitalist during your hospital stay. If you have any questions about your discharge medications or the care you received while you were in the hospital after you are discharged, you can call the unit and asked to speak with the hospitalist on call if the hospitalist that took care of you is not available. Once you are discharged, your primary care physician will handle any further medical issues. Please note that NO REFILLS for any discharge medications will be authorized once you are discharged, as it is imperative that you return to your primary care physician (or establish a relationship with a primary care physician if you do not have one) for your aftercare needs so that they can reassess your need for medications and monitor your  lab values.    Today   CHIEF COMPLAINT:   Chief Complaint  Patient presents with  . Abdominal Pain    HISTORY OF PRESENT ILLNESS:  Erica Poole  is a 70 y.o. female presented with abdominal pain.   VITAL SIGNS:  Blood pressure 132/65, pulse 60, temperature 98.2 F (36.8 C), temperature source Oral, resp. rate 18, height 4\' 11"  (1.499 m), weight 73.2 kg (161 lb 6 oz), SpO2 94 %.  I/O:  No intake or output data in the 24 hours ending 12/30/15 1457  PHYSICAL EXAMINATION:  GENERAL:  70 y.o.-year-old patient lying in the bed with no acute distress.  EYES: Pupils equal, round, reactive to light and accommodation. No scleral icterus. Extraocular muscles intact.  HEENT: Head atraumatic, normocephalic. Oropharynx and nasopharynx clear.  NECK:  Supple, no jugular venous distention. No thyroid enlargement, no tenderness.  LUNGS: Normal breath sounds bilaterally, no wheezing, rales,rhonchi or crepitation. No use of accessory muscles of respiration.  CARDIOVASCULAR: S1, S2 normal. No murmurs, rubs, or gallops.  ABDOMEN: Soft, non-tender, non-distended. Bowel sounds present. No organomegaly or mass.  EXTREMITIES: trace pedal edema, cyanosis, or clubbing.  NEUROLOGIC: Cranial nerves  II through XII are intact. Muscle strength 5/5 in all extremities. Sensation intact. Gait not checked.  PSYCHIATRIC: The patient is alert and oriented x 3.  SKIN: No obvious rash, lesion, or ulcer.    Management plans discussed with the patient, and she is in agreement.  CODE STATUS:  Code Status History    Date Active Date Inactive Code Status Order ID Comments User Context   12/20/2015  4:30 PM 12/22/2015  3:14 PM Full Code [place]:[id number]  Henreitta Leber, MD Inpatient      TOTAL TIME TAKING CARE OF THIS PATIENT: 35 minutes.    Loletha Grayer M.D on 12/30/2015 at 2:57 PM  Between 7am to 6pm - Pager - [phone]  After 6pm go to [email or web address] - password [email or web address] [facility]   [phone]  CC: Primary care physician; Halina Maidens, MD

## 2016-01-01 ENCOUNTER — Ambulatory Visit (INDEPENDENT_AMBULATORY_CARE_PROVIDER_SITE_OTHER): Payer: Medicare Other | Admitting: Internal Medicine

## 2016-01-01 ENCOUNTER — Encounter: Payer: Self-pay | Admitting: Internal Medicine

## 2016-01-01 VITALS — BP 105/82 | HR 72 | Resp 16 | Ht 59.0 in | Wt 159.2 lb

## 2016-01-01 DIAGNOSIS — Z1231 Encounter for screening mammogram for malignant neoplasm of breast: Secondary | ICD-10-CM

## 2016-01-01 DIAGNOSIS — Z7901 Long term (current) use of anticoagulants: Secondary | ICD-10-CM

## 2016-01-01 DIAGNOSIS — Z720 Tobacco use: Secondary | ICD-10-CM | POA: Diagnosis not present

## 2016-01-01 DIAGNOSIS — J441 Chronic obstructive pulmonary disease with (acute) exacerbation: Secondary | ICD-10-CM | POA: Diagnosis not present

## 2016-01-01 DIAGNOSIS — I10 Essential (primary) hypertension: Secondary | ICD-10-CM

## 2016-01-01 DIAGNOSIS — J439 Emphysema, unspecified: Secondary | ICD-10-CM | POA: Diagnosis not present

## 2016-01-01 DIAGNOSIS — F17201 Nicotine dependence, unspecified, in remission: Secondary | ICD-10-CM

## 2016-01-01 DIAGNOSIS — R1031 Right lower quadrant pain: Secondary | ICD-10-CM

## 2016-01-01 MED ORDER — ALBUTEROL SULFATE HFA 108 (90 BASE) MCG/ACT IN AERS: 2.0000 | INHALATION_SPRAY | Freq: Four times a day (QID) | RESPIRATORY_TRACT | 12 refills | Status: AC | PRN

## 2016-01-01 NOTE — Progress Notes (Signed)
Date:  01/01/2016   Name:  Erica Poole   DOB:  Mar 05, 1946   MRN:  [id number]   Chief Complaint: Hospitalization Follow-up and COPD Hypertension  This is a chronic problem. The current episode started more than 1 year ago. The problem is unchanged. The problem is controlled. Associated symptoms include shortness of breath. Pertinent negatives include no chest pain or palpitations.   Discharged from [facility] with COPD exacerbation and abdominal pain on 12/22/15. COPD - now on O2 @ 3 lpm around the clock.  She finished the levaquin and prednisone and has had no further issues.  She quit smoking completely.  She feels well and is able to do all her ADLs.  RLQ Abd pain - had severe pain that took her to the ER.  She had a full workup and was seen by Gen Surgery with no cause found.  The pain has resolved and not returned.  Hx DVT and PE - on long term anticoagulation with a DOAC.  No bleeding problems.  No worsening of LE edema.   Review of Systems  Constitutional: Negative for chills, diaphoresis and fatigue.  Eyes: Positive for visual disturbance.  Respiratory: Positive for cough and shortness of breath. Negative for chest tightness and wheezing.   Cardiovascular: Negative for chest pain, palpitations and leg swelling.  Gastrointestinal: Negative for abdominal pain, constipation and diarrhea.  Genitourinary: Negative for dysuria.  Neurological: Negative for tremors and weakness.    Patient Active Problem List   Diagnosis Date Noted  . Right lower quadrant abdominal pain   . On continuous oral anticoagulation 09/08/2015  . Controlled gout 01/27/2015  . Hyperlipidemia 01/27/2015  . Personal history of tobacco use, presenting hazards to health 01/23/2015  . Bilateral pulmonary embolism ([place]) 05/20/2012  . Left leg DVT ([place]) 05/20/2012  . Essential hypertension   . Chronic obstructive pulmonary disease with acute exacerbation ([place])   . Fibrocystic breast disease   . Tobacco abuse     . Polycythemia   . Chronic diastolic CHF (congestive heart failure) ([place])     Prior to Admission medications   Medication Sig Start Date End Date Taking? Authorizing Provider  albuterol (PROVENTIL HFA;VENTOLIN HFA) 108 (90 BASE) MCG/ACT inhaler Inhale 2 puffs into the lungs every 6 (six) hours as needed for wheezing or shortness of breath. 03/10/15  Yes Glean Hess, MD  allopurinol (ZYLOPRIM) 100 MG tablet Take 1 tablet by mouth daily.    Yes Historical Provider, MD  atorvastatin (LIPITOR) 20 MG tablet Take 20 mg by mouth daily.   Yes Historical Provider, MD  budesonide-formoterol (SYMBICORT) 160-4.5 MCG/ACT inhaler Inhale 2 puffs into the lungs 2 (two) times daily. 09/08/15  Yes Glean Hess, MD  furosemide (LASIX) 20 MG tablet Take 20 mg by mouth daily.    Yes Historical Provider, MD  ipratropium-albuterol (DUONEB) 0.5-2.5 (3) MG/3ML SOLN Take 3 mLs by nebulization every 6 (six) hours as needed. 09/08/15  Yes Glean Hess, MD  lisinopril-hydrochlorothiazide (PRINZIDE,ZESTORETIC) 10-12.5 MG per tablet Take 1 tablet by mouth daily. 01/20/15  Yes Historical Provider, MD  magnesium oxide (MAG-OX) 400 MG tablet Take 1 tablet (400 mg total) by mouth daily. Recommend mag oxide brand-name his elbow over-the-counter yet 01/20/15  Yes Frederich Cha, MD  meclizine (ANTIVERT) 12.5 MG tablet Take 1 tablet (12.5 mg total) by mouth 2 (two) times daily. 09/08/15  Yes Glean Hess, MD  Misc Natural Products (BLACK CHERRY CONCENTRATE) LIQD Take 15 mLs by mouth 1 day  or 1 dose. For gout   Yes Historical Provider, MD  OXYGEN Inhale 3 L/day into the lungs.   Yes Historical Provider, MD  potassium chloride (K-DUR) 10 MEQ tablet Take 10 mEq by mouth daily.   Yes Historical Provider, MD  rivaroxaban (XARELTO) 20 MG TABS tablet Take 20 mg by mouth daily with supper.   Yes Historical Provider, MD    No Known Allergies  Past Surgical History:  Procedure Laterality Date  . CHOLECYSTECTOMY    . TUBAL  LIGATION    . Vocal cord polyp excision      Social History  Substance Use Topics  . Smoking status: Former Smoker    Packs/day: 1.00    Years: 50.00    Types: Cigarettes    Quit date: 12/25/2015  . Smokeless tobacco: Never Used  . Alcohol use 1.2 oz/week    2 Standard drinks or equivalent per week     Medication list has been reviewed and updated.   Physical Exam  Constitutional: She is oriented to person, place, and time. She appears well-developed. No distress.  HENT:  Head: Normocephalic and atraumatic.  Neck: Carotid bruit is not present.  Cardiovascular: Normal rate, regular rhythm and normal heart sounds.   Pulmonary/Chest: Effort normal. No respiratory distress. She has decreased breath sounds. She has no wheezes. She has no rhonchi.  Musculoskeletal: Normal range of motion.  Lymphadenopathy:    She has no cervical adenopathy.  Neurological: She is alert and oriented to person, place, and time. She has normal reflexes.  Skin: Skin is warm and dry. No rash noted.  Psychiatric: She has a normal mood and affect. Her speech is normal and behavior is normal. Thought content normal.  Nursing note and vitals reviewed.   BP 105/82 (BP Location: Right Arm, Patient Position: Sitting, Cuff Size: Normal)   Pulse 72   Resp 16   Ht 4\' 11"  (1.499 m)   Wt 159 lb 3.2 oz (72.2 kg)   SpO2 95% Comment: 3 liters  BMI 32.15 kg/m   Assessment and Plan: 1. Chronic obstructive pulmonary disease with acute exacerbation ([place]) Improved Encouraged to resume symbicort Congratulated on quitting smoking  2. Essential hypertension controlled  3. Right lower quadrant abdominal pain resolved  4. On continuous oral anticoagulation stable  5. Pulmonary emphysema, unspecified emphysema type ([place]) stable   Halina Maidens, MD [facility]  01/01/2016

## 2016-01-02 ENCOUNTER — Other Ambulatory Visit: Payer: Self-pay | Admitting: Internal Medicine

## 2016-01-02 ENCOUNTER — Telehealth: Payer: Self-pay | Admitting: Internal Medicine

## 2016-01-02 DIAGNOSIS — Z87898 Personal history of other specified conditions: Secondary | ICD-10-CM

## 2016-01-02 NOTE — Telephone Encounter (Signed)
Error

## 2016-01-03 ENCOUNTER — Telehealth: Payer: Self-pay

## 2016-01-03 NOTE — Telephone Encounter (Addendum)
Tried to go see Erica Poole and she is not home or not answering for social worker even with cars in drive. Wants to reschedule visit for next week. Gave verbal.

## 2016-01-09 ENCOUNTER — Ambulatory Visit: Admission: RE | Admit: 2016-01-09 | Payer: Medicare Other | Source: Ambulatory Visit

## 2016-01-16 ENCOUNTER — Other Ambulatory Visit: Payer: Self-pay | Admitting: Internal Medicine

## 2016-01-16 ENCOUNTER — Ambulatory Visit
Admission: RE | Admit: 2016-01-16 | Discharge: 2016-01-16 | Disposition: A | Discharge status: Home / Self Care | Payer: Medicare Other | Source: Ambulatory Visit | Attending: Internal Medicine | Admitting: Internal Medicine

## 2016-01-16 DIAGNOSIS — Z9189 Other specified personal risk factors, not elsewhere classified: Secondary | ICD-10-CM | POA: Diagnosis present

## 2016-01-16 DIAGNOSIS — Z87898 Personal history of other specified conditions: Secondary | ICD-10-CM

## 2016-01-19 ENCOUNTER — Telehealth: Payer: Self-pay

## 2016-01-19 ENCOUNTER — Encounter (INDEPENDENT_AMBULATORY_CARE_PROVIDER_SITE_OTHER): Payer: Medicare Other | Admitting: Internal Medicine

## 2016-01-19 DIAGNOSIS — F17201 Nicotine dependence, unspecified, in remission: Secondary | ICD-10-CM

## 2016-01-19 DIAGNOSIS — D751 Secondary polycythemia: Secondary | ICD-10-CM

## 2016-01-19 DIAGNOSIS — J441 Chronic obstructive pulmonary disease with (acute) exacerbation: Secondary | ICD-10-CM | POA: Diagnosis not present

## 2016-01-19 DIAGNOSIS — E785 Hyperlipidemia, unspecified: Secondary | ICD-10-CM

## 2016-01-19 DIAGNOSIS — I2699 Other pulmonary embolism without acute cor pulmonale: Secondary | ICD-10-CM

## 2016-01-19 DIAGNOSIS — M109 Gout, unspecified: Secondary | ICD-10-CM

## 2016-01-19 DIAGNOSIS — I10 Essential (primary) hypertension: Secondary | ICD-10-CM

## 2016-01-19 NOTE — Progress Notes (Signed)
Received orders from [facility]. Start of care 12/26/2015. Certification. 12/26/2015 through 02/23/2016. Home health orders are reviewed signed and faxed.

## 2016-01-19 NOTE — Telephone Encounter (Signed)
Concerned why Lasix 20 and Potassium patient has is 2015 bottles. Should she be taking these? It is historical on our med list so not sure if you want any changes.

## 2016-04-12 ENCOUNTER — Encounter: Payer: Self-pay | Admitting: *Deleted

## 2016-06-13 DEATH — deceased

## 2016-06-21 IMAGING — CR DG CHEST 1V PORT
1 series · 1 of 1 positions shown · non-contrast
Comparison: 04/28/2012.

CLINICAL DATA: COPD.  Hypoxia.

EXAM:
PORTABLE CHEST - 1 VIEW

[ap]
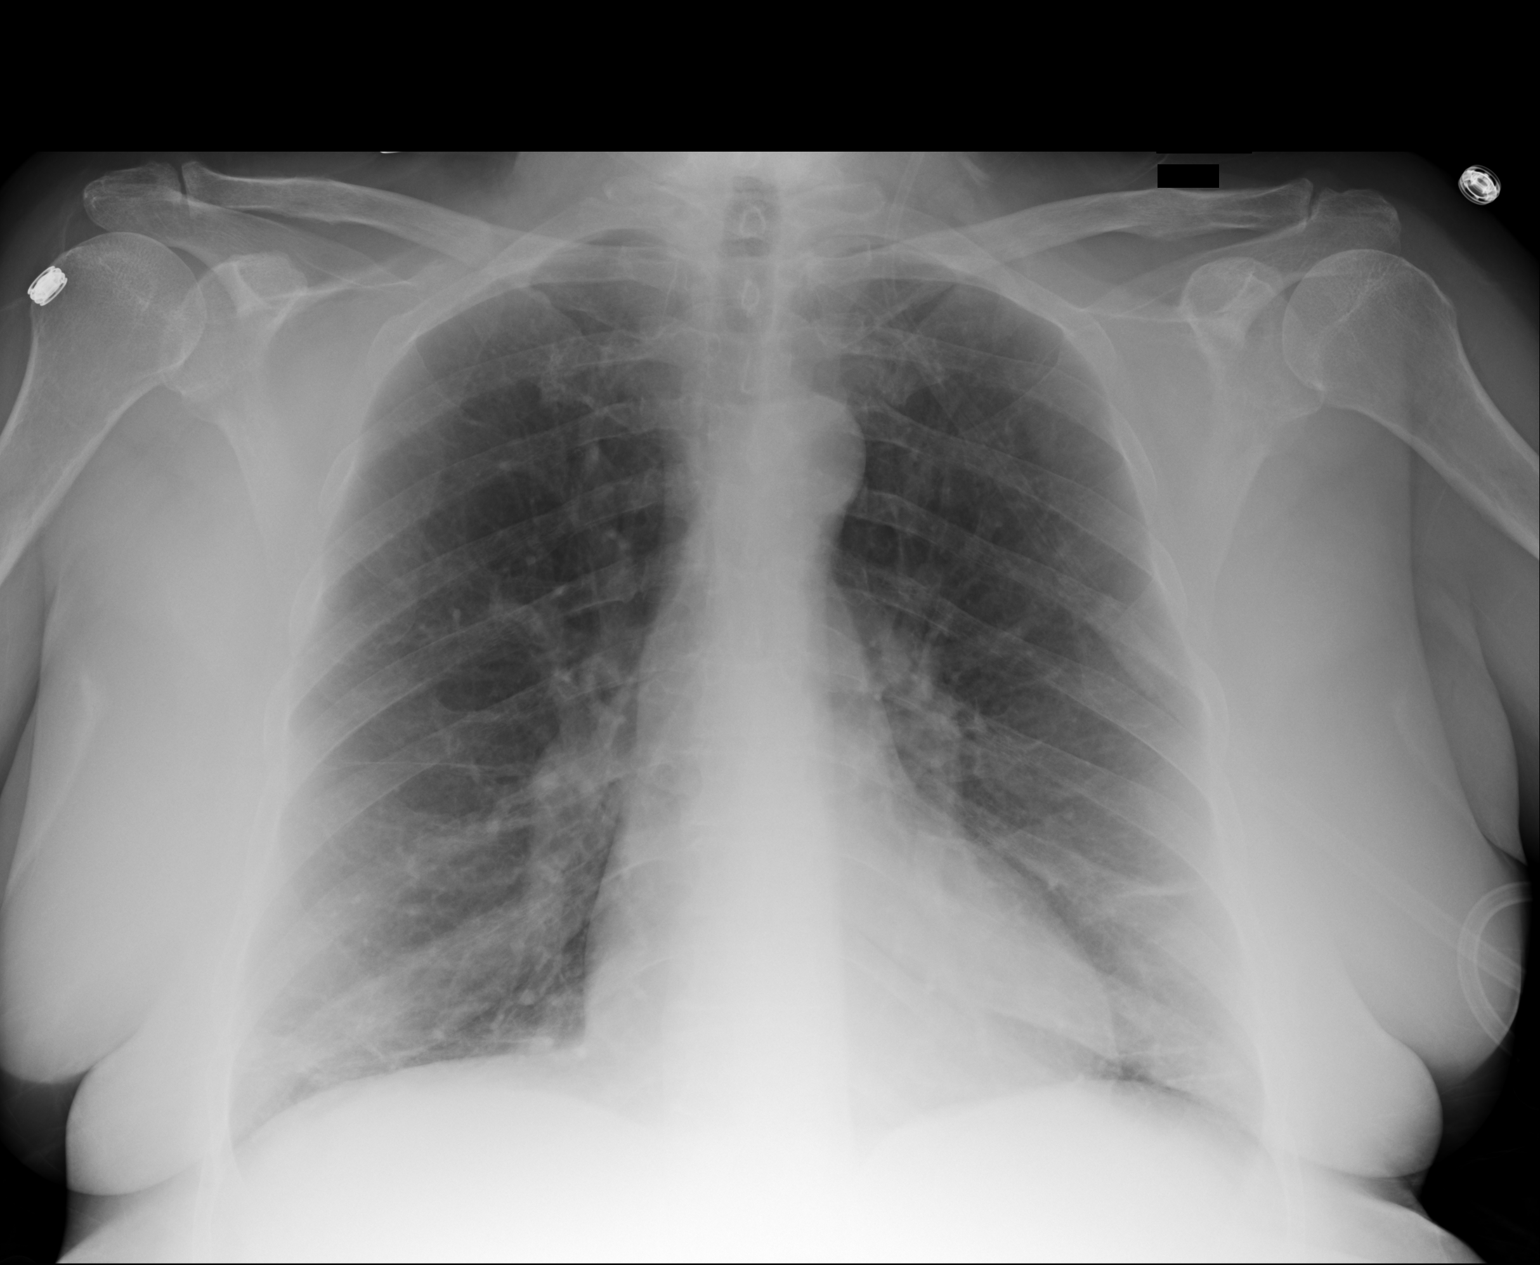

[1 of 1 positions shown; findings below may reference images not displayed]

FINDINGS: Cardiac silhouette is normal in size. Mediastinum is normal in
contour and caliber. No hilar masses. No evidence of adenopathy.

Lungs are hyperexpanded. There is mild reticular subsegmental
atelectasis and/or scarring at the bases. No lung consolidation or
edema. No pleural effusion or pneumothorax.

The bony thorax is demineralized but intact.
IMPRESSION: 1. COPD. No acute cardiopulmonary disease. Stable appearance from
the prior study.

## 2018-07-16 IMAGING — CR DG HIP (WITH OR WITHOUT PELVIS) 2-3V*R*
1 series · 3 of 3 positions shown · non-contrast
Comparison: None.

CLINICAL DATA: Right hip pain for 3 days. No known injury. Initial
encounter.

EXAM:
DG HIP (WITH OR WITHOUT PELVIS) 2-3V RIGHT

[Series 1: dg hip unilat w or w/o pelvis 2-3 views  · non-contrast · 0.14mm/px · 3 of 3 slices shown]
[im 1/3]
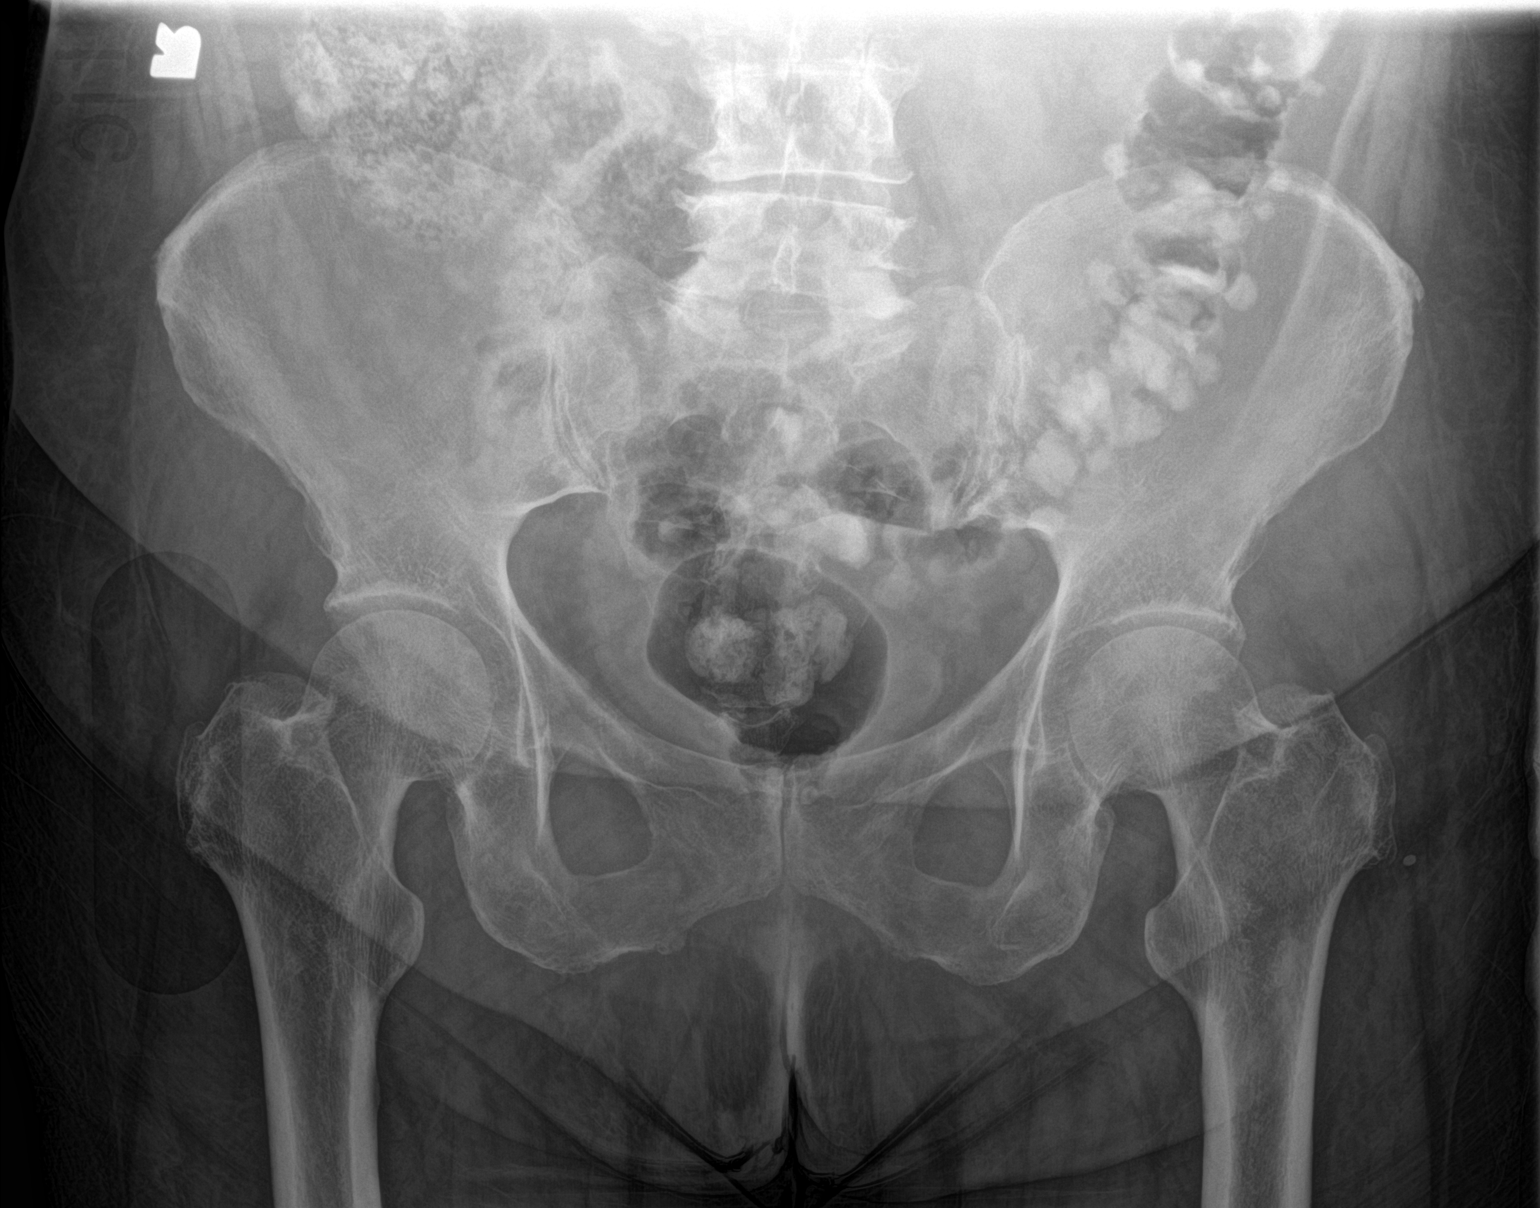
[im 2/3]
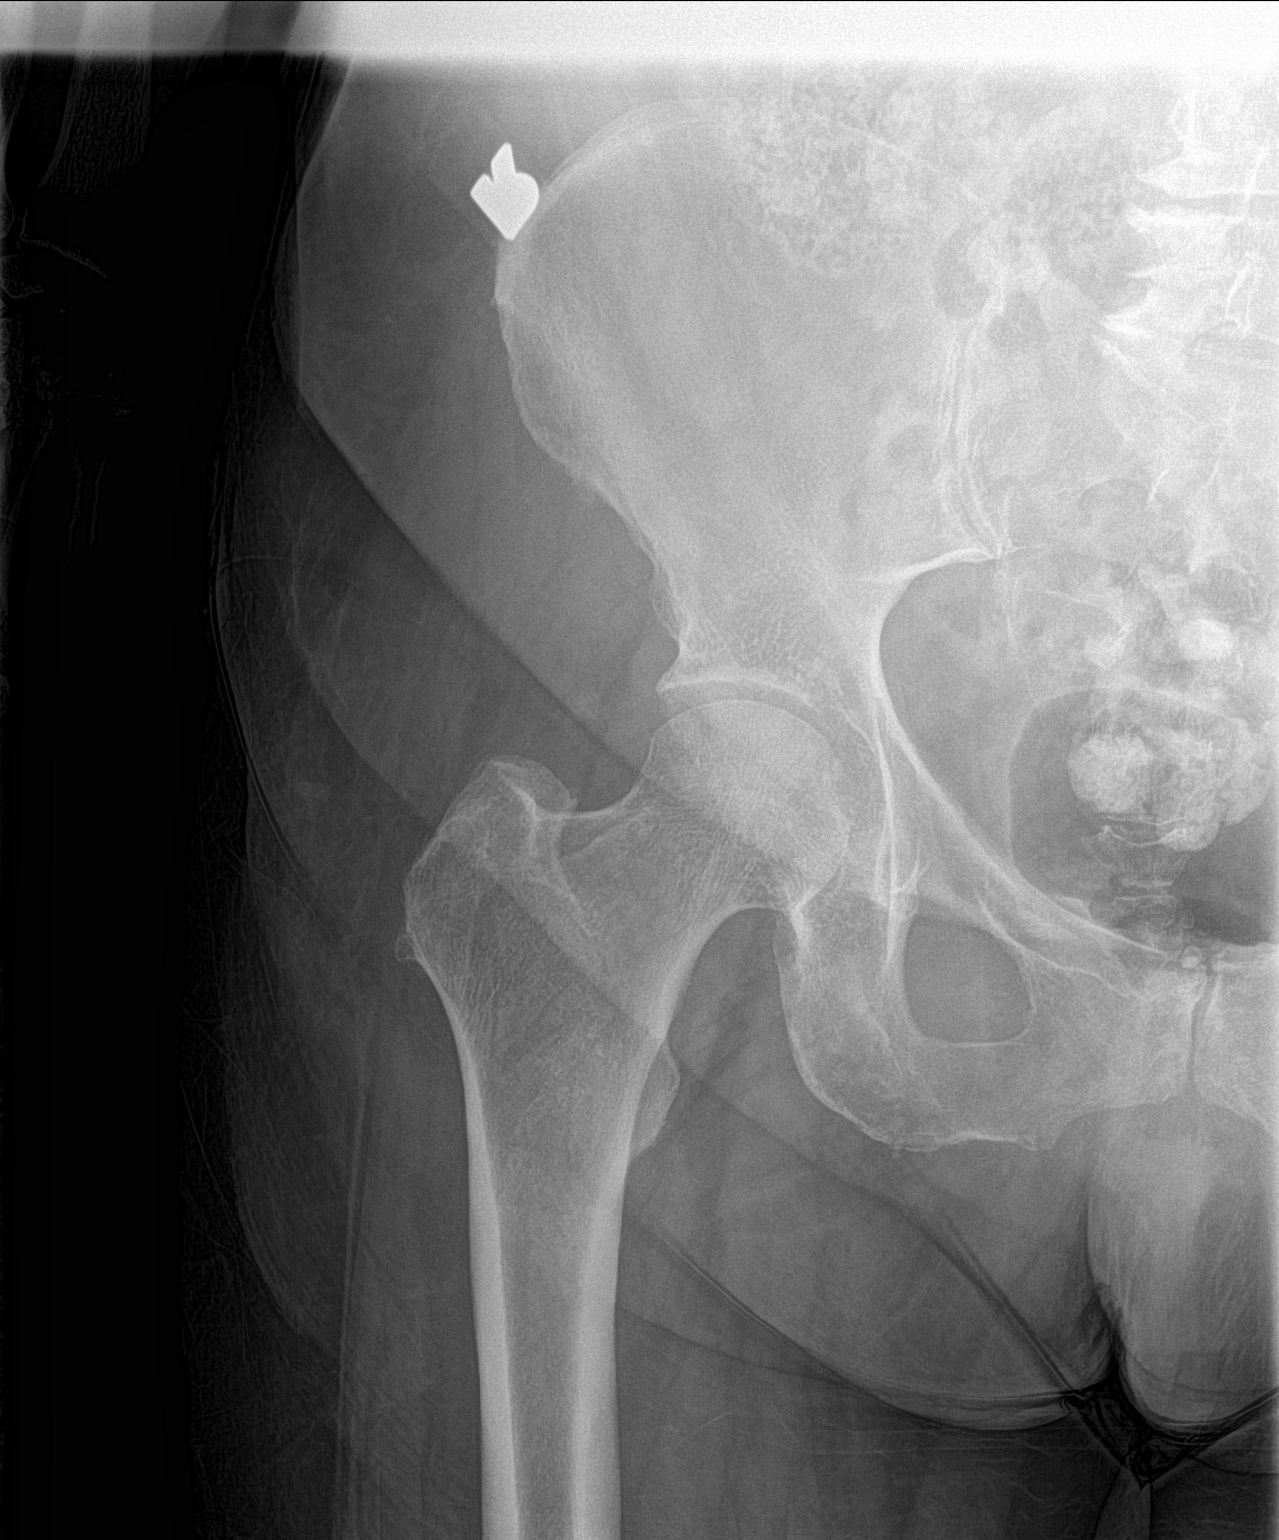
[im 3/3]
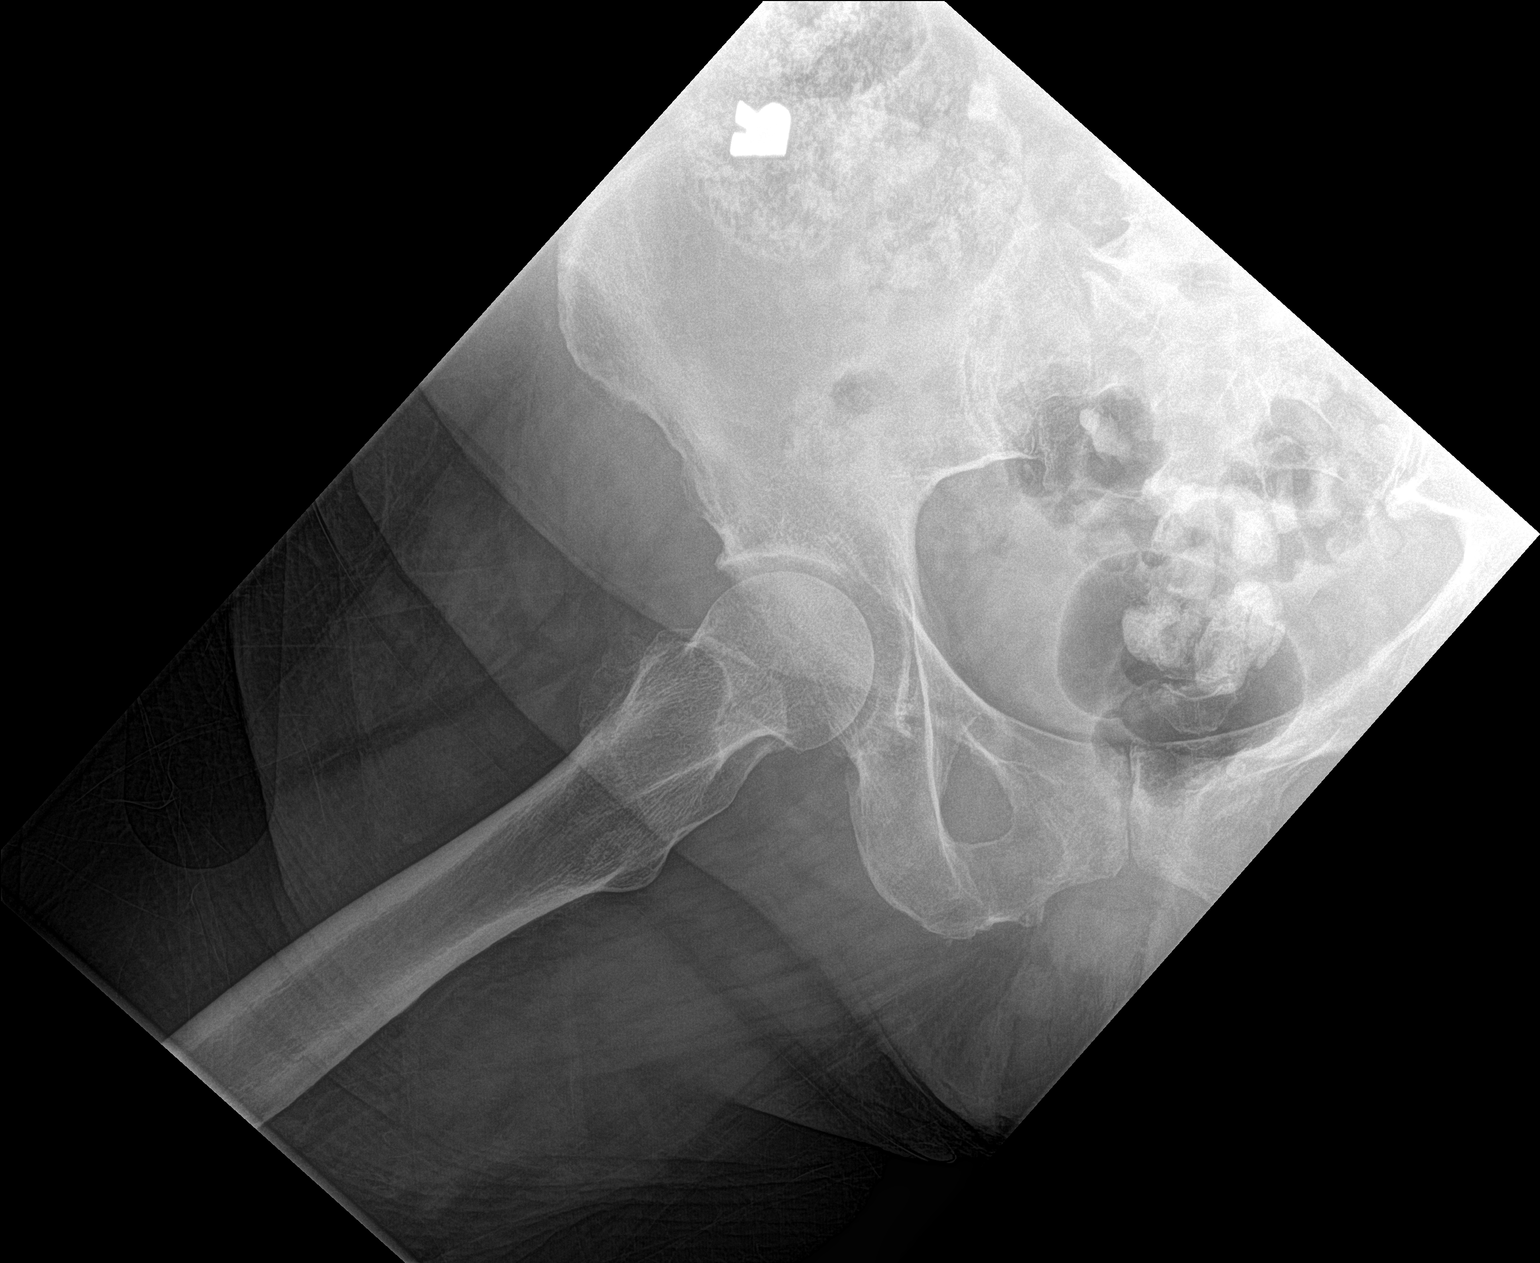

[3 of 3 positions shown; findings below may reference images not displayed]

FINDINGS: There is no evidence of hip fracture or dislocation. There is no
evidence of arthropathy or other focal bone abnormality.
Diverticular disease in the descending and sigmoid colon noted.
IMPRESSION: No acute abnormality.
